# Patient Record
Sex: Female | Born: 1975
Health system: Southern US, Community
[De-identification: ages and names within clinical notes are randomized; demographics above are authoritative.]

## PROBLEM LIST (undated history)

## (undated) DIAGNOSIS — D259 Leiomyoma of uterus, unspecified: Secondary | ICD-10-CM

## (undated) DIAGNOSIS — J309 Allergic rhinitis, unspecified: Secondary | ICD-10-CM

## (undated) DIAGNOSIS — F419 Anxiety disorder, unspecified: Secondary | ICD-10-CM

## (undated) HISTORY — PX: APPENDECTOMY: SHX54

---

## 2003-02-27 ENCOUNTER — Emergency Department (HOSPITAL_COMMUNITY): Admission: EM | Admit: 2003-02-27 | Discharge: 2003-02-27 | Payer: Self-pay | Admitting: Emergency Medicine

## 2004-08-02 ENCOUNTER — Other Ambulatory Visit: Admission: RE | Admit: 2004-08-02 | Discharge: 2004-08-02 | Payer: Self-pay | Admitting: Obstetrics and Gynecology

## 2005-01-22 ENCOUNTER — Ambulatory Visit: Payer: Self-pay | Admitting: Obstetrics and Gynecology

## 2005-01-22 ENCOUNTER — Inpatient Hospital Stay (HOSPITAL_COMMUNITY): Admission: AD | Admit: 2005-01-22 | Discharge: 2005-01-22 | Payer: Self-pay | Admitting: Obstetrics and Gynecology

## 2005-01-28 ENCOUNTER — Ambulatory Visit: Payer: Self-pay | Admitting: *Deleted

## 2005-02-04 ENCOUNTER — Ambulatory Visit: Payer: Self-pay | Admitting: *Deleted

## 2005-02-11 ENCOUNTER — Ambulatory Visit: Payer: Self-pay | Admitting: *Deleted

## 2005-02-12 ENCOUNTER — Inpatient Hospital Stay (HOSPITAL_COMMUNITY): Admission: AD | Admit: 2005-02-12 | Discharge: 2005-02-12 | Payer: Self-pay | Admitting: Obstetrics and Gynecology

## 2005-02-18 ENCOUNTER — Inpatient Hospital Stay (HOSPITAL_COMMUNITY): Admission: AD | Admit: 2005-02-18 | Discharge: 2005-02-19 | Payer: Self-pay | Admitting: Family Medicine

## 2005-02-18 ENCOUNTER — Ambulatory Visit: Payer: Self-pay | Admitting: Family Medicine

## 2008-08-08 ENCOUNTER — Ambulatory Visit: Payer: Self-pay | Admitting: Family Medicine

## 2008-08-08 DIAGNOSIS — L03119 Cellulitis of unspecified part of limb: Secondary | ICD-10-CM

## 2008-08-08 DIAGNOSIS — L02419 Cutaneous abscess of limb, unspecified: Secondary | ICD-10-CM | POA: Insufficient documentation

## 2015-06-25 DIAGNOSIS — R875 Abnormal microbiological findings in specimens from female genital organs: Secondary | ICD-10-CM | POA: Diagnosis not present

## 2015-06-25 DIAGNOSIS — R718 Other abnormality of red blood cells: Secondary | ICD-10-CM | POA: Diagnosis not present

## 2015-06-25 DIAGNOSIS — Z124 Encounter for screening for malignant neoplasm of cervix: Secondary | ICD-10-CM | POA: Diagnosis not present

## 2015-06-25 DIAGNOSIS — Z0000 Encounter for general adult medical examination without abnormal findings: Secondary | ICD-10-CM | POA: Diagnosis not present

## 2015-06-25 DIAGNOSIS — Z13220 Encounter for screening for lipoid disorders: Secondary | ICD-10-CM | POA: Diagnosis not present

## 2015-07-12 DIAGNOSIS — Z1231 Encounter for screening mammogram for malignant neoplasm of breast: Secondary | ICD-10-CM | POA: Diagnosis not present

## 2015-08-15 DIAGNOSIS — D225 Melanocytic nevi of trunk: Secondary | ICD-10-CM | POA: Diagnosis not present

## 2016-01-14 DIAGNOSIS — F439 Reaction to severe stress, unspecified: Secondary | ICD-10-CM | POA: Diagnosis not present

## 2016-01-14 DIAGNOSIS — F418 Other specified anxiety disorders: Secondary | ICD-10-CM | POA: Diagnosis not present

## 2016-02-20 DIAGNOSIS — F4322 Adjustment disorder with anxiety: Secondary | ICD-10-CM | POA: Diagnosis not present

## 2016-02-20 DIAGNOSIS — F418 Other specified anxiety disorders: Secondary | ICD-10-CM | POA: Diagnosis not present

## 2016-02-20 DIAGNOSIS — F439 Reaction to severe stress, unspecified: Secondary | ICD-10-CM | POA: Diagnosis not present

## 2016-03-04 DIAGNOSIS — F4322 Adjustment disorder with anxiety: Secondary | ICD-10-CM | POA: Diagnosis not present

## 2016-03-18 DIAGNOSIS — F4322 Adjustment disorder with anxiety: Secondary | ICD-10-CM | POA: Diagnosis not present

## 2016-04-09 DIAGNOSIS — F4322 Adjustment disorder with anxiety: Secondary | ICD-10-CM | POA: Diagnosis not present

## 2016-09-23 DIAGNOSIS — Z130 Encounter for screening for diseases of the blood and blood-forming organs and certain disorders involving the immune mechanism: Secondary | ICD-10-CM | POA: Diagnosis not present

## 2016-09-23 DIAGNOSIS — Z0000 Encounter for general adult medical examination without abnormal findings: Secondary | ICD-10-CM | POA: Diagnosis not present

## 2016-09-23 DIAGNOSIS — Z13220 Encounter for screening for lipoid disorders: Secondary | ICD-10-CM | POA: Diagnosis not present

## 2016-12-21 DIAGNOSIS — J0101 Acute recurrent maxillary sinusitis: Secondary | ICD-10-CM | POA: Diagnosis not present

## 2017-01-15 DIAGNOSIS — F439 Reaction to severe stress, unspecified: Secondary | ICD-10-CM | POA: Diagnosis not present

## 2017-01-15 DIAGNOSIS — F418 Other specified anxiety disorders: Secondary | ICD-10-CM | POA: Diagnosis not present

## 2017-02-04 DIAGNOSIS — J069 Acute upper respiratory infection, unspecified: Secondary | ICD-10-CM | POA: Diagnosis not present

## 2017-03-12 DIAGNOSIS — F439 Reaction to severe stress, unspecified: Secondary | ICD-10-CM | POA: Diagnosis not present

## 2017-03-12 DIAGNOSIS — R918 Other nonspecific abnormal finding of lung field: Secondary | ICD-10-CM | POA: Diagnosis not present

## 2017-03-12 DIAGNOSIS — F418 Other specified anxiety disorders: Secondary | ICD-10-CM | POA: Diagnosis not present

## 2017-03-12 DIAGNOSIS — J069 Acute upper respiratory infection, unspecified: Secondary | ICD-10-CM | POA: Diagnosis not present

## 2017-07-09 ENCOUNTER — Other Ambulatory Visit (HOSPITAL_COMMUNITY)
Admission: RE | Admit: 2017-07-09 | Discharge: 2017-07-09 | Disposition: A | Discharge status: Home / Self Care | Payer: BLUE CROSS/BLUE SHIELD | Source: Ambulatory Visit | Attending: Nurse Practitioner | Admitting: Nurse Practitioner

## 2017-07-09 ENCOUNTER — Other Ambulatory Visit: Payer: Self-pay | Admitting: Nurse Practitioner

## 2017-07-09 DIAGNOSIS — Z716 Tobacco abuse counseling: Secondary | ICD-10-CM | POA: Diagnosis not present

## 2017-07-09 DIAGNOSIS — Z01419 Encounter for gynecological examination (general) (routine) without abnormal findings: Secondary | ICD-10-CM | POA: Insufficient documentation

## 2017-07-09 DIAGNOSIS — N920 Excessive and frequent menstruation with regular cycle: Secondary | ICD-10-CM | POA: Diagnosis not present

## 2017-07-15 LAB — CYTOLOGY - PAP
Adequacy: ABSENT
Diagnosis: NEGATIVE
HPV: NOT DETECTED

## 2017-07-16 DIAGNOSIS — N920 Excessive and frequent menstruation with regular cycle: Secondary | ICD-10-CM | POA: Diagnosis not present

## 2017-07-22 DIAGNOSIS — N920 Excessive and frequent menstruation with regular cycle: Secondary | ICD-10-CM | POA: Diagnosis not present

## 2017-10-06 DIAGNOSIS — G44209 Tension-type headache, unspecified, not intractable: Secondary | ICD-10-CM | POA: Diagnosis not present

## 2017-10-06 DIAGNOSIS — L723 Sebaceous cyst: Secondary | ICD-10-CM | POA: Diagnosis not present

## 2017-12-15 DIAGNOSIS — J029 Acute pharyngitis, unspecified: Secondary | ICD-10-CM | POA: Diagnosis not present

## 2017-12-15 DIAGNOSIS — J020 Streptococcal pharyngitis: Secondary | ICD-10-CM | POA: Diagnosis not present

## 2018-04-13 DIAGNOSIS — E010 Iodine-deficiency related diffuse (endemic) goiter: Secondary | ICD-10-CM | POA: Diagnosis not present

## 2018-04-13 DIAGNOSIS — Z13220 Encounter for screening for lipoid disorders: Secondary | ICD-10-CM | POA: Diagnosis not present

## 2018-04-13 DIAGNOSIS — Z0000 Encounter for general adult medical examination without abnormal findings: Secondary | ICD-10-CM | POA: Diagnosis not present

## 2018-04-15 ENCOUNTER — Other Ambulatory Visit: Payer: Self-pay | Admitting: Physician Assistant

## 2018-04-15 DIAGNOSIS — E010 Iodine-deficiency related diffuse (endemic) goiter: Secondary | ICD-10-CM

## 2018-05-10 DIAGNOSIS — J029 Acute pharyngitis, unspecified: Secondary | ICD-10-CM | POA: Diagnosis not present

## 2018-05-10 DIAGNOSIS — J0140 Acute pansinusitis, unspecified: Secondary | ICD-10-CM | POA: Diagnosis not present

## 2018-05-10 DIAGNOSIS — J111 Influenza due to unidentified influenza virus with other respiratory manifestations: Secondary | ICD-10-CM | POA: Diagnosis not present

## 2018-05-11 ENCOUNTER — Ambulatory Visit
Admission: RE | Admit: 2018-05-11 | Discharge: 2018-05-11 | Disposition: A | Discharge status: Home / Self Care | Payer: BLUE CROSS/BLUE SHIELD | Source: Ambulatory Visit | Attending: Physician Assistant | Admitting: Physician Assistant

## 2018-05-11 DIAGNOSIS — E042 Nontoxic multinodular goiter: Secondary | ICD-10-CM | POA: Diagnosis not present

## 2018-05-11 DIAGNOSIS — E010 Iodine-deficiency related diffuse (endemic) goiter: Secondary | ICD-10-CM

## 2018-05-14 ENCOUNTER — Other Ambulatory Visit: Payer: Self-pay | Admitting: Physician Assistant

## 2018-05-14 DIAGNOSIS — E041 Nontoxic single thyroid nodule: Secondary | ICD-10-CM

## 2018-05-18 ENCOUNTER — Other Ambulatory Visit (HOSPITAL_COMMUNITY)
Admission: RE | Admit: 2018-05-18 | Discharge: 2018-05-18 | Disposition: A | Discharge status: Home / Self Care | Payer: BLUE CROSS/BLUE SHIELD | Source: Ambulatory Visit | Attending: Physician Assistant | Admitting: Physician Assistant

## 2018-05-18 ENCOUNTER — Ambulatory Visit
Admission: RE | Admit: 2018-05-18 | Discharge: 2018-05-18 | Disposition: A | Discharge status: Home / Self Care | Payer: BLUE CROSS/BLUE SHIELD | Source: Ambulatory Visit | Attending: Physician Assistant | Admitting: Physician Assistant

## 2018-05-18 DIAGNOSIS — E041 Nontoxic single thyroid nodule: Secondary | ICD-10-CM

## 2018-05-18 NOTE — Procedures (Signed)
PROCEDURE SUMMARY:  Using direct ultrasound guidance, 4 passes were made using 25 g needles into the nodule within the left lobe of the thyroid.   Ultrasound was used to confirm needle placements on all occasions.   EBL = trace  Specimens were sent to Pathology for analysis.  See procedure note under Imaging tab in Epic for full procedure details.  Tekela Garguilo S Zaela Graley PA-C 05/18/2018 9:50 AM

## 2018-07-29 DIAGNOSIS — Z03818 Encounter for observation for suspected exposure to other biological agents ruled out: Secondary | ICD-10-CM | POA: Diagnosis not present

## 2019-01-17 DIAGNOSIS — N898 Other specified noninflammatory disorders of vagina: Secondary | ICD-10-CM | POA: Diagnosis not present

## 2019-01-17 DIAGNOSIS — R102 Pelvic and perineal pain: Secondary | ICD-10-CM | POA: Diagnosis not present

## 2019-01-17 DIAGNOSIS — K602 Anal fissure, unspecified: Secondary | ICD-10-CM | POA: Diagnosis not present

## 2019-02-25 DIAGNOSIS — Z20828 Contact with and (suspected) exposure to other viral communicable diseases: Secondary | ICD-10-CM | POA: Diagnosis not present

## 2019-02-25 DIAGNOSIS — U071 COVID-19: Secondary | ICD-10-CM | POA: Diagnosis not present

## 2019-03-05 DIAGNOSIS — R0981 Nasal congestion: Secondary | ICD-10-CM | POA: Diagnosis not present

## 2019-03-05 DIAGNOSIS — B9721 SARS-associated coronavirus as the cause of diseases classified elsewhere: Secondary | ICD-10-CM | POA: Diagnosis not present

## 2019-03-05 DIAGNOSIS — G44009 Cluster headache syndrome, unspecified, not intractable: Secondary | ICD-10-CM | POA: Diagnosis not present

## 2019-03-05 DIAGNOSIS — R5383 Other fatigue: Secondary | ICD-10-CM | POA: Diagnosis not present

## 2019-07-15 DIAGNOSIS — H6980 Other specified disorders of Eustachian tube, unspecified ear: Secondary | ICD-10-CM | POA: Diagnosis not present

## 2019-08-28 DIAGNOSIS — N3000 Acute cystitis without hematuria: Secondary | ICD-10-CM | POA: Diagnosis not present

## 2019-09-12 ENCOUNTER — Other Ambulatory Visit: Payer: Self-pay | Admitting: Family Medicine

## 2019-09-12 DIAGNOSIS — Z1231 Encounter for screening mammogram for malignant neoplasm of breast: Secondary | ICD-10-CM

## 2019-09-28 ENCOUNTER — Ambulatory Visit
Admission: RE | Admit: 2019-09-28 | Discharge: 2019-09-28 | Disposition: A | Discharge status: Home / Self Care | Payer: BC Managed Care – PPO | Source: Ambulatory Visit | Attending: Family Medicine | Admitting: Family Medicine

## 2019-09-28 ENCOUNTER — Other Ambulatory Visit: Payer: Self-pay

## 2019-09-28 DIAGNOSIS — Z1231 Encounter for screening mammogram for malignant neoplasm of breast: Secondary | ICD-10-CM | POA: Diagnosis not present

## 2019-10-18 DIAGNOSIS — Z03818 Encounter for observation for suspected exposure to other biological agents ruled out: Secondary | ICD-10-CM | POA: Diagnosis not present

## 2019-10-18 DIAGNOSIS — Z20822 Contact with and (suspected) exposure to covid-19: Secondary | ICD-10-CM | POA: Diagnosis not present

## 2019-10-24 DIAGNOSIS — M79645 Pain in left finger(s): Secondary | ICD-10-CM | POA: Diagnosis not present

## 2019-10-25 DIAGNOSIS — Z20822 Contact with and (suspected) exposure to covid-19: Secondary | ICD-10-CM | POA: Diagnosis not present

## 2019-10-25 DIAGNOSIS — U071 COVID-19: Secondary | ICD-10-CM | POA: Diagnosis not present

## 2019-11-02 DIAGNOSIS — N898 Other specified noninflammatory disorders of vagina: Secondary | ICD-10-CM | POA: Diagnosis not present

## 2019-11-02 DIAGNOSIS — Z30015 Encounter for initial prescription of vaginal ring hormonal contraceptive: Secondary | ICD-10-CM | POA: Diagnosis not present

## 2019-11-02 DIAGNOSIS — N920 Excessive and frequent menstruation with regular cycle: Secondary | ICD-10-CM | POA: Diagnosis not present

## 2019-11-02 DIAGNOSIS — Z01419 Encounter for gynecological examination (general) (routine) without abnormal findings: Secondary | ICD-10-CM | POA: Diagnosis not present

## 2019-11-02 DIAGNOSIS — R87610 Atypical squamous cells of undetermined significance on cytologic smear of cervix (ASC-US): Secondary | ICD-10-CM | POA: Diagnosis not present

## 2020-01-16 DIAGNOSIS — R102 Pelvic and perineal pain: Secondary | ICD-10-CM | POA: Diagnosis not present

## 2020-01-16 DIAGNOSIS — N920 Excessive and frequent menstruation with regular cycle: Secondary | ICD-10-CM | POA: Diagnosis not present

## 2020-01-17 DIAGNOSIS — N920 Excessive and frequent menstruation with regular cycle: Secondary | ICD-10-CM | POA: Diagnosis not present

## 2020-01-17 DIAGNOSIS — R102 Pelvic and perineal pain: Secondary | ICD-10-CM | POA: Diagnosis not present

## 2020-05-10 IMAGING — US US FNA BIOPSY THYROID 1ST LESION
1 series · 13 of 16 positions shown · non-contrast
Comparison: Ultrasound done May 11, 2018

MEDICATIONS:
1% lidocaine 3 mL

COMPLICATIONS:
None immediate.

INDICATION: Indeterminate thyroid nodule

EXAM:
ULTRASOUND GUIDED FINE NEEDLE ASPIRATION OF INDETERMINATE THYROID
NODULE
TECHNIQUE: Informed written consent was obtained from the patient after a
discussion of the risks, benefits and alternatives to treatment.
Questions regarding the procedure were encouraged and answered. A
timeout was performed prior to the initiation of the procedure.

[Series 1: us fna biopsy thyroid 1st lesion · 0.04mm/px · 16 acquisitions, 13 frames shown]
[im 1/16]
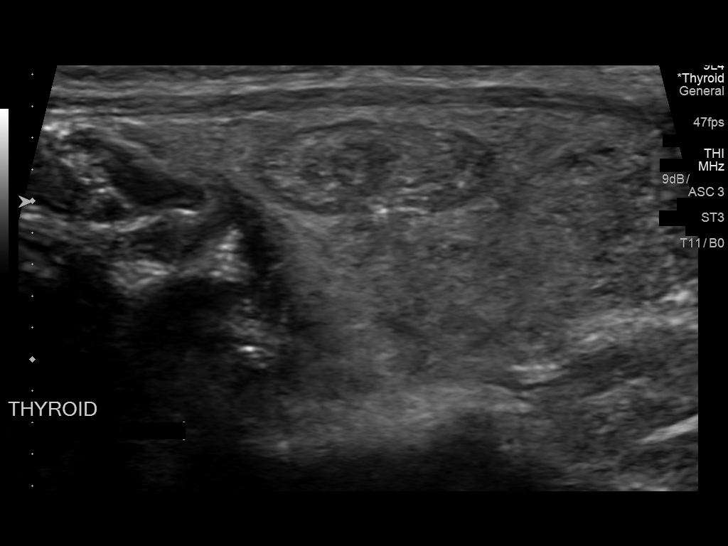
[im 2/16]
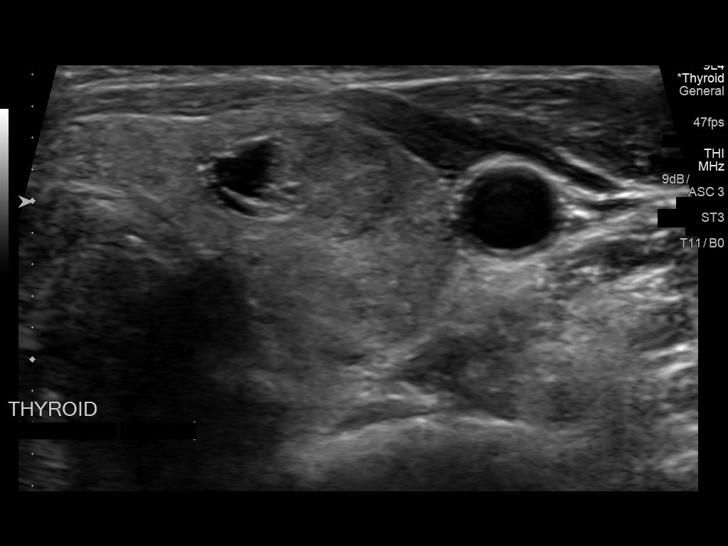
[im 4/16]
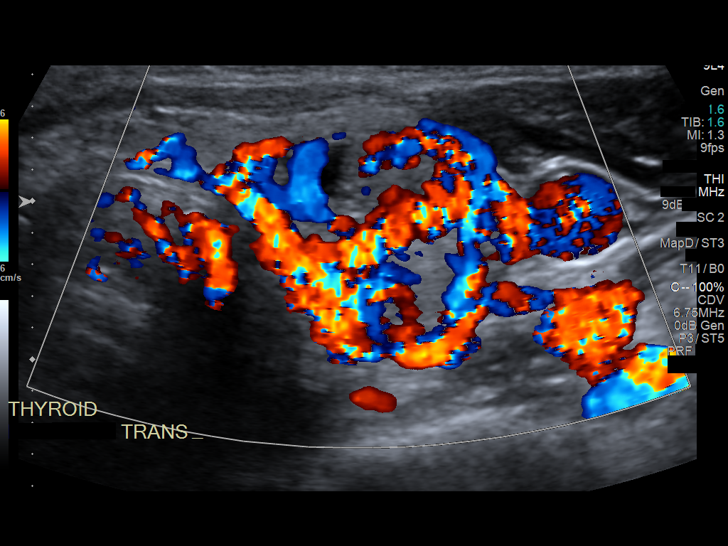
[im 5/16]
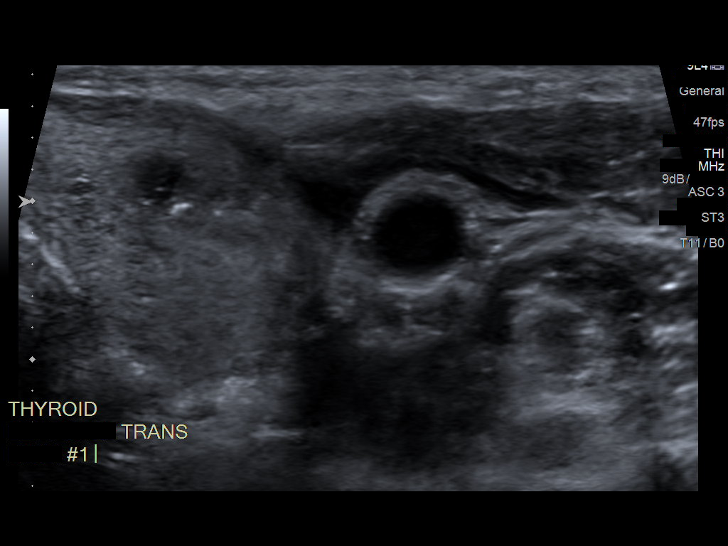
[im 6/16]
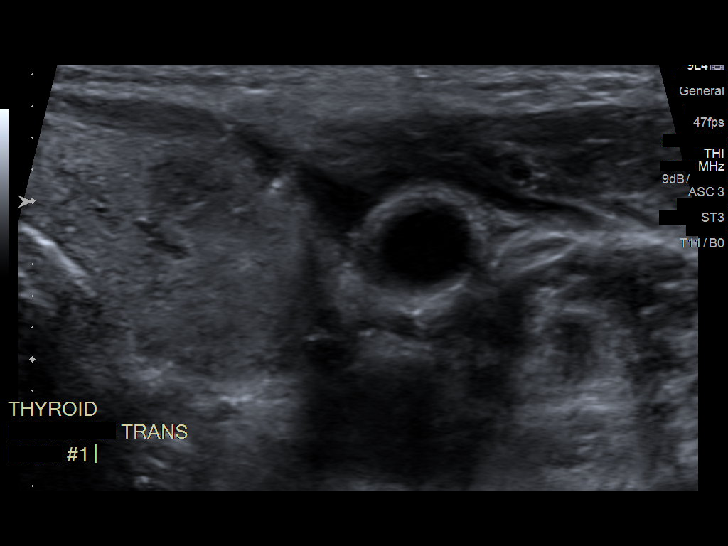
[im 7/16]
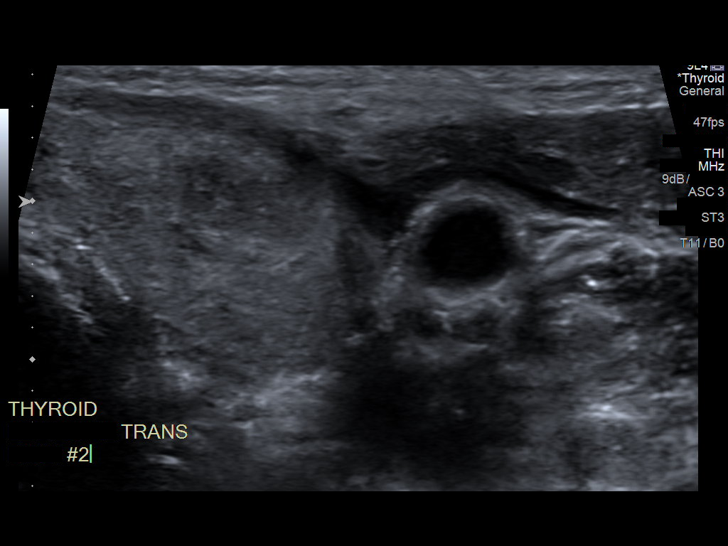
[im 9/16]
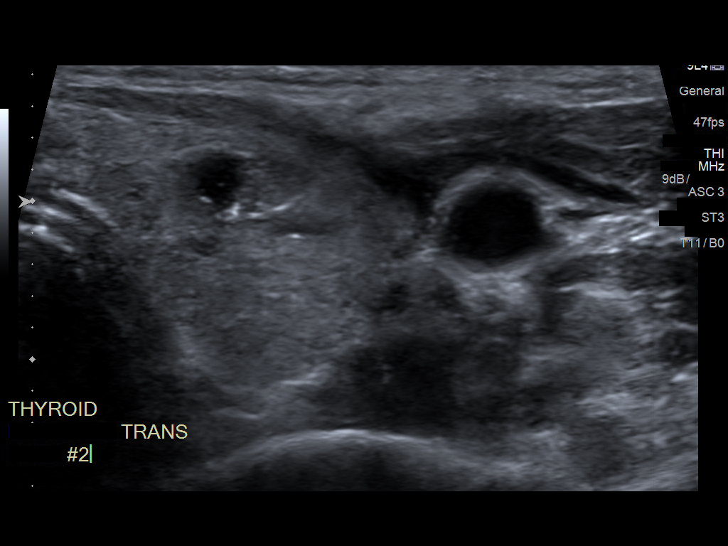
[im 10/16]
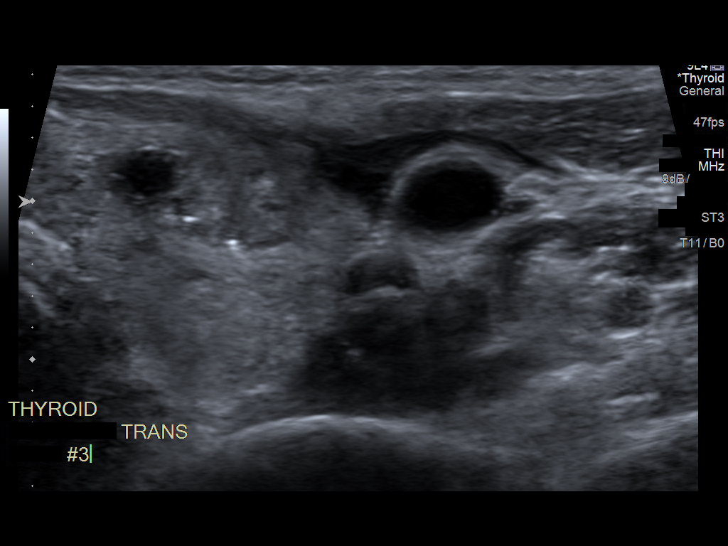
[im 11/16]
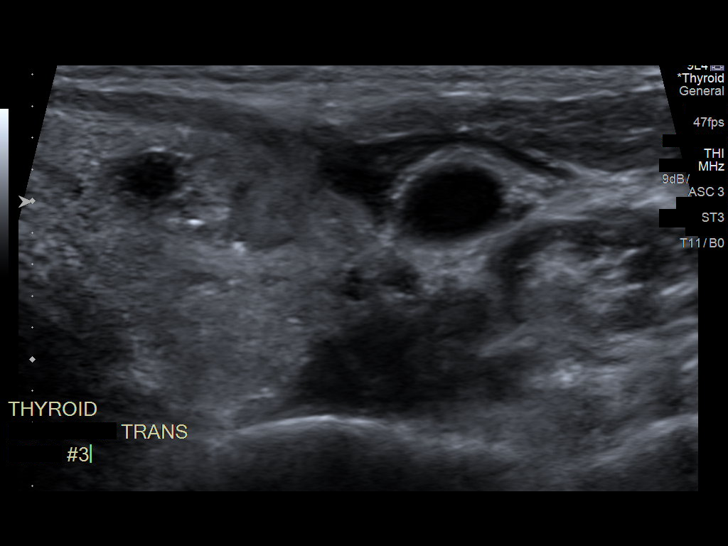
[im 12/16]
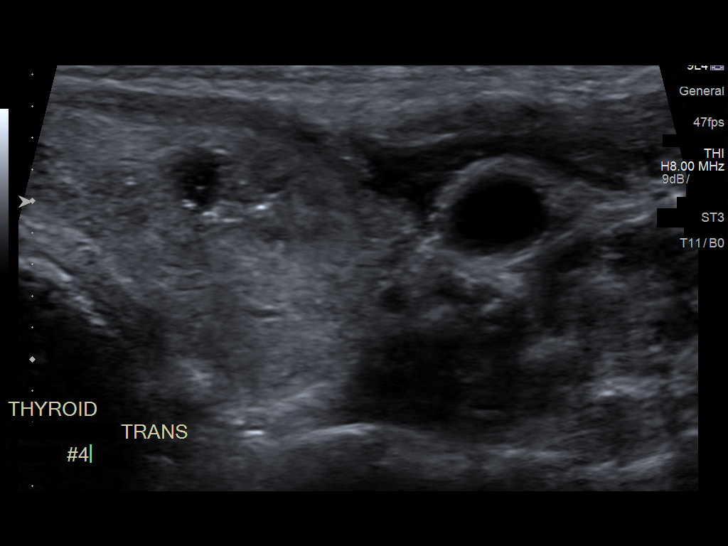
[im 13/16]
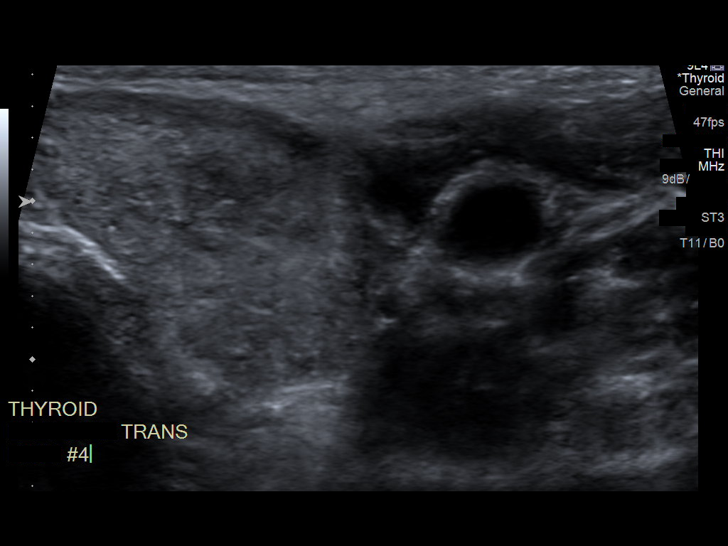
[im 15/16]
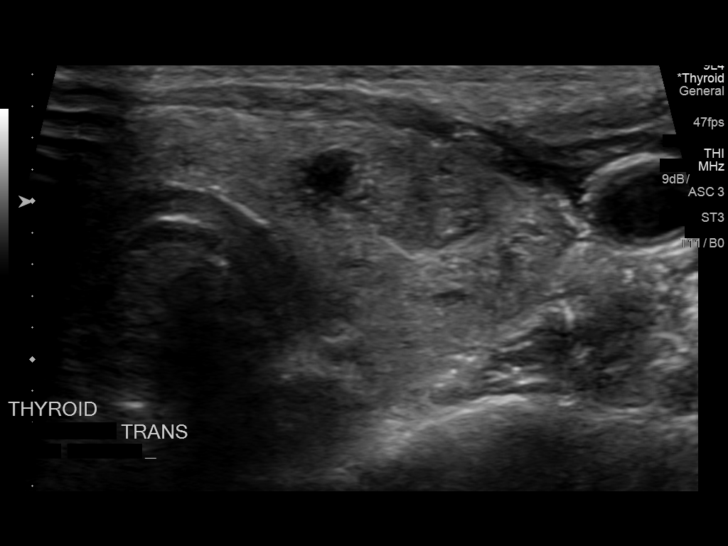
[im 16/16]
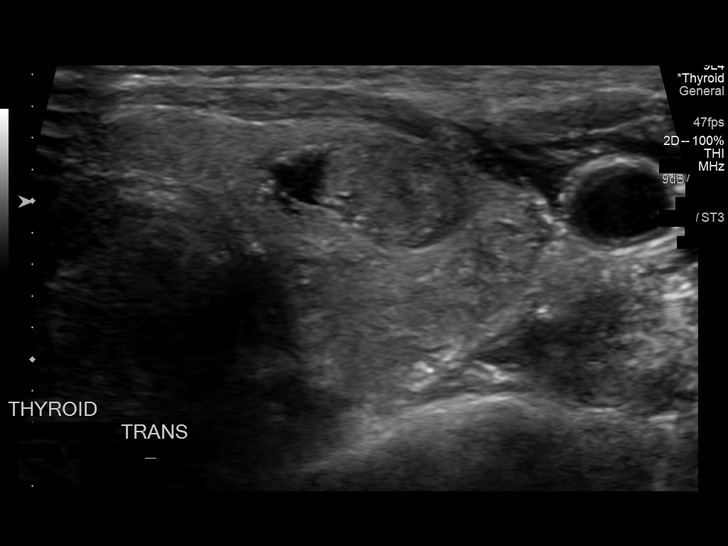

[13 of 16 positions shown; findings below may reference images not displayed]

Pre-procedural ultrasound scanning demonstrated unchanged size and
appearance of the indeterminate nodule within the left lobe of the
thyroid.

The procedure was planned. The neck was prepped in the usual sterile
fashion, and a sterile drape was applied covering the operative
field. A timeout was performed prior to the initiation of the
procedure. Local anesthesia was provided with 1% lidocaine.

Under direct ultrasound guidance, 4 FNA biopsies were performed of
the left thyroid nodule with a 25 gauge needle. Multiple ultrasound
images were saved for procedural documentation purposes. The samples
were prepared and submitted to pathology.

Limited post procedural scanning was negative for hematoma or
additional complication. Dressings were placed. The patient
tolerated the above procedures procedure well without immediate
postprocedural complication.
FINDINGS: FINDINGS
Nodule reference number based on prior diagnostic ultrasound: 3

Maximum size: 1.5 cm

Location: Left  ;  Mid

ACR TI-RADS risk category:  TR4

Reason for biopsy: meets ACR TI-RADS criteria

Ultrasound imaging confirms appropriate placement of the needles
within the thyroid nodule.
IMPRESSION: Technically successful ultrasound guided fine needle aspiration of
the left thyroid nodule.

## 2020-05-24 DIAGNOSIS — Z30015 Encounter for initial prescription of vaginal ring hormonal contraceptive: Secondary | ICD-10-CM | POA: Diagnosis not present

## 2020-05-24 DIAGNOSIS — F418 Other specified anxiety disorders: Secondary | ICD-10-CM | POA: Diagnosis not present

## 2020-05-24 DIAGNOSIS — L409 Psoriasis, unspecified: Secondary | ICD-10-CM | POA: Diagnosis not present

## 2020-05-24 DIAGNOSIS — J309 Allergic rhinitis, unspecified: Secondary | ICD-10-CM | POA: Diagnosis not present

## 2020-06-12 DIAGNOSIS — M9903 Segmental and somatic dysfunction of lumbar region: Secondary | ICD-10-CM | POA: Diagnosis not present

## 2020-11-08 DIAGNOSIS — R635 Abnormal weight gain: Secondary | ICD-10-CM | POA: Diagnosis not present

## 2020-11-08 DIAGNOSIS — Z01419 Encounter for gynecological examination (general) (routine) without abnormal findings: Secondary | ICD-10-CM | POA: Diagnosis not present

## 2020-11-08 DIAGNOSIS — Z113 Encounter for screening for infections with a predominantly sexual mode of transmission: Secondary | ICD-10-CM | POA: Diagnosis not present

## 2021-02-26 DIAGNOSIS — J00 Acute nasopharyngitis [common cold]: Secondary | ICD-10-CM | POA: Diagnosis not present

## 2021-02-26 DIAGNOSIS — R52 Pain, unspecified: Secondary | ICD-10-CM | POA: Diagnosis not present

## 2021-04-17 DIAGNOSIS — J069 Acute upper respiratory infection, unspecified: Secondary | ICD-10-CM | POA: Diagnosis not present

## 2021-04-17 DIAGNOSIS — N920 Excessive and frequent menstruation with regular cycle: Secondary | ICD-10-CM | POA: Diagnosis not present

## 2021-04-23 DIAGNOSIS — N920 Excessive and frequent menstruation with regular cycle: Secondary | ICD-10-CM | POA: Diagnosis not present

## 2021-04-30 ENCOUNTER — Encounter (HOSPITAL_BASED_OUTPATIENT_CLINIC_OR_DEPARTMENT_OTHER): Payer: Self-pay | Admitting: Obstetrics and Gynecology

## 2021-05-01 ENCOUNTER — Encounter (HOSPITAL_BASED_OUTPATIENT_CLINIC_OR_DEPARTMENT_OTHER): Payer: Self-pay | Admitting: Obstetrics and Gynecology

## 2021-05-02 NOTE — Progress Notes (Signed)
Spoke w/ via phone for pre-op interview---pt Lab needs dos----    urine poct per anesthesia, surgery orders pending           Lab results------none COVID test -----patient states asymptomatic no test needed Arrive at -------1200 pm 05-08-2021 NPO after MN NO Solid Food.  Clear liquids from MN until---1100 am Med rec completed Medications to take morning of surgery -----loratadine, flonase Diabetic medication -----n/a Patient instructed no nail polish to be worn day of surgery patient to remove s & s from both index fingers for surgery Patient instructed to bring photo id and insurance card day of surgery Patient aware to have Driver (ride ) / caregiver  boyfriend doug   for 24 hours after surgery  Patient Special Instructions -----surgery orders req dr Landry Mellow epic ib Pre-Op special Istructions -----none Patient verbalized understanding of instructions that were given at this phone interview. Patient denies shortness of breath, chest pain, fever, cough at this phone interview.   Medical history done 05-01-2021 [facility] RN

## 2021-05-07 ENCOUNTER — Other Ambulatory Visit: Payer: Self-pay | Admitting: Obstetrics and Gynecology

## 2021-05-07 DIAGNOSIS — N920 Excessive and frequent menstruation with regular cycle: Secondary | ICD-10-CM

## 2021-05-07 NOTE — H&P (Deleted)
  The note originally documented on this encounter has been moved the the encounter in which it belongs.  

## 2021-05-07 NOTE — H&P (Signed)
Subjective: Chief Complaint(s):   Pelvic/Preop/Heavy menses   HPI:  General 46 yo presents for pre-op visit for hysteroscopy D/C with hydrothermal ablation for manangement of menorrhagia with regular cycle on 05/08/2021 at 2:00p. Last visit on 04/17/2021, pt reported her periods had been very heavy. She reported her LMP was Jan 26th, 2023 and it lasted 6 days. She reported on her heaviest day she is changing an ultra tampon in an hour. She reported this heavy bleeding lasts at least 3 days of her cycle. Pt reported accidents on her clothes due to heavy bleeding. Pt has tried the NuvaRing in the past with little relief.  Discussed performing a repeat US for interval evaluation. Discussed vaginal hysterectomy. Discussed risks of hysterectomy including but not limited to infection, bleeding, conversion to larger incision, damage to her bowel, bladder, or ureters, with the need for further surgery. Discussed post-surgery avoidance of driving for 1 week and avoidance of lifting weights greater than 10 lbs or intercourse for 6-8 weeks after the procedure. Discussed hydrothermal ablation. Discussed Irving Copas sure ablation. DIscussed post-surgery avoidance of intercourse for 2 weeks after the procedure. Pt is interested in hydrothermal ablation at this time. Pt reports sinus pressure since the first Thursday of Jan 2023. Pt reports she has tried mucinex and aromatherapy with little relief. Current Medication: Taking  Clobetasol Propionate 5.49 % Shampoo 1 application Externally Once a day, Notes: Needs refill.     Fluticasone Propionate 50 MCG/ACT Suspension USE 1-2 SPRAYS IN EACH NOSTRIL ONCE A DAY , Notes: prn.     Loratadine-D 24HR(Loratadine-Pseudoephedrine ER) 10-240 MG Tablet Extended Release 24 Hour 1 tablet as needed Orally Once a day.     Ferrous Sulfate 325 (65 Fe) MG Tablet 1 tablet Orally Once a day.     Anusol-HC(Hydrocortisone Acetate) 2.5 % Cream 1 application to affected area Rectal Twice a day  for 7 days then prn, Notes: PRN.     Ibuprofen 200 MG Tablet Orally 3 tablets as needed, Notes: prn.     Aleve(Naproxen) , Notes: prn.   Discontinued  Zithromax Z-pak(Azithromycin) 250 MG Tablet 2 pills by mouth the first day, then 1 pill once a day by mouth for 4 days Orally Once a day.     Zithromax Z-pak(Azithromycin) 250 MG Tablet 2 tablets the first day, then 1 tablet once a day by mouth for 4 days Orally Once a day.     Medication List reviewed and reconciled with the patient.  Medical History:  seasonal allergies     fibroids      Allergies/Intolerance: N.K.D.A. Gyn History:  Sexual activity currently sexually active. Periods : every month, regular. LMP 04/11/2021. Denies Birth control none. Last pap smear date 11/02/2019 - ASCUS, ECC present, HPV neg. Last mammogram date 09/28/2019. H/O Abnormal pap smear 1999 during pregnancy 2021 - ASCUS. Denies STD. Menarche 85.   OB History:  Number of pregnancies 4. Pregnancy # 1 vaginal delivery, live birth, , girl. Pregnancy # 2 vaginal delivery, live birth, girl. Pregnancy # 3 vaginal delivery, live birth, boy. Pregnancy # 4: vaginal delivery, live birth, girl.   Surgical History:  Appendectomy 2000     Lasix in Lt eye 2016   Hospitalization:  Childbirth 4x   Family History:  Father: deceased, Hyperlipidemia    Mother: deceased, AML    Maternal Grand Father: Heart disease    Sister 1: alive    Maternal aunt: deceased, Lung cancer, diagnosed with Breast cancer    1 sister(s) . 1 son(s) ,  3 daughter(s) .    pt had covid in 2021, denies any GYN family cancer hx, No Family History of Colon Cancer.  Social History: General Tobacco use cigarettes: Former smoker, Quit in year 03/2019, Tobacco history last updated 04/23/2021, Vaping Yes.  EXPOSURE TO PASSIVE SMOKE: yes.  Alcohol: yes, Rare.  Caffeine: yes, 2+ servings daily, coffee, tea.  Recreational drug use: yes, In high school-marijuana, last use age 34.  Exercise: yes,  Walking around her neighborhood 6x a wk.  Marital Status: Divorced,.  Children: 4 ages 76-20 (3 girls, 1 boy).  EDUCATION: Some College.  OCCUPATION: employed, [employer].  Seat belt use: most of the time.  ROS: CONSTITUTIONAL No" options="no,yes" propid="91" itemid="193425" categoryid="10464" encounterid="[id number]"Chills No. No" options="no,yes" propid="91" itemid="172899" categoryid="10464" encounterid="[id number]"Fatigue No. No" options="no,yes" propid="91" itemid="10467" categoryid="10464" encounterid="[id number]"Fever No. No" options="no,yes" propid="91" itemid="193426" categoryid="10464" encounterid="[id number]"Night sweats No. No" options="no,yes" propid="91" itemid="444261" categoryid="10464" encounterid="[id number]"Recent travel outside [place] No. No" options="no,yes" propid="91" itemid="193427" categoryid="10464" encounterid="14795855"Sweats No. No" options="no,yes" propid="91" itemid="194825" categoryid="10464" encounterid="[id number]"Weight change No.  OPHTHALMOLOGY no" options="no,yes" propid="91" itemid="12520" categoryid="12516" encounterid="[id number]"Blurring of vision no. no" options="no,yes" propid="91" itemid="193469" categoryid="12516" encounterid="[id number]"Change in vision no. no" options="no,yes" propid="91" itemid="194379" categoryid="12516" encounterid="[id number]"Double vision no.  ENT no" options="no,yes" propid="91" itemid="193612" categoryid="10481" encounterid="[id number]"Dizziness no. Nose bleeds no. Sore throat no. Teeth pain no.  ALLERGY no" options="no,yes" propid="91" itemid="202589" categoryid="138152" encounterid="14795855"Hives no.  CARDIOLOGY no" options="no,yes" propid="91" itemid="193603" categoryid="10488" encounterid="[id number]"Chest pain no. no" options="no,yes" propid="91" itemid="199089" categoryid="10488" encounterid="[id number]"High blood pressure no. no" options="no,yes" propid="91" itemid="202598" categoryid="10488" encounterid="14795855"Irregular heart beat no. no"  options="no,yes" propid="91" itemid="10491" categoryid="10488" encounterid="14795855"Leg edema no. no" options="no,yes" propid="91" itemid="10490" categoryid="10488" encounterid="14795855"Palpitations no.  RESPIRATORY no" options="no" propid="91" itemid="270013" categoryid="138132" encounterid="14795855"Shortness of breath no. no" options="no,yes" propid="91" itemid="172745" categoryid="138132" encounterid="14795855"Cough no. no" options="no,yes" propid="91" itemid="193621" categoryid="138132" encounterid="14795855"Wheezing no.  UROLOGY no" options="no,yes" propid="91" itemid="194377" categoryid="138166" encounterid="14795855"Pain with urination no. no" options="no,yes" propid="91" itemid="193493" categoryid="138166" encounterid="14795855"Urinary urgency no. no" options="no,yes" propid="91" itemid="193492" categoryid="138166" encounterid="14795855"Urinary frequency no. no" options="no,yes" propid="91" itemid="138171" categoryid="138166" encounterid="14795855"Urinary incontinence no. No" options="no,yes" propid="91" itemid="138167" categoryid="138166" encounterid="14795855"Difficulty urinating No. No" options="no,yes" propid="91" itemid="138168" categoryid="138166" encounterid="14795855"Blood in urine No.  GASTROENTEROLOGY no" options="no,yes" propid="91" itemid="10496" categoryid="10494" encounterid="14795855"Abdominal pain no. no" options="no,yes" propid="91" itemid="193447" categoryid="10494" encounterid="14795855"Appetite change no. no" options="no,yes" propid="91" itemid="193448" categoryid="10494" encounterid="14795855"Bloating/belching no. no" options="no,yes" propid="91" itemid="10503" categoryid="10494" encounterid="14795855"Blood in stool or on toilet paper no. no" options="no,yes" propid="91" itemid="199106" categoryid="10494" encounterid="14795855"Change in bowel movements no. no" options="no,yes" propid="91" itemid="10501" categoryid="10494" encounterid="14795855"Constipation no. no"  options="no,yes" propid="91" itemid="10502" categoryid="10494" encounterid="14795855"Diarrhea no. no" options="no,yes" propid="91" itemid="199104" categoryid="10494" encounterid="14795855"Difficulty swallowing no. no" options="no,yes" propid="91" itemid="10499" categoryid="10494" encounterid="14795855"Nausea no.  FEMALE REPRODUCTIVE no" options="no,yes" propid="91" itemid="453725" categoryid="10525" encounterid="14795855"Vulvar pain no. no" options="no,yes" propid="91" itemid="453726" categoryid="10525" encounterid="14795855"Vulvar rash no. heavy menses" options="no, yes" propid="91" itemid="444315" categoryid="10525" encounterid="14795855"Abnormal vaginal bleeding heavy menses. no" options="no,yes" propid="91" itemid="186083" categoryid="10525" encounterid="14795855"Breast pain no. no" options="no,yes" propid="91" itemid="186084" categoryid="10525" encounterid="14795855"Nipple discharge no. no" options="no,yes" propid="91" itemid="275823" categoryid="10525" encounterid="14795855"Pain with intercourse no. no" options="no,yes" propid="91" itemid="186082" categoryid="10525" encounterid="14795855"Pelvic pain no. no" options="no,yes" propid="91" itemid="278230" categoryid="10525" encounterid="14795855"Unusual vaginal discharge no. no" options="no,yes" propid="91" itemid="278942" categoryid="10525" encounterid="14795855"Vaginal itching no.  MUSCULOSKELETAL no" options="no,yes" propid="91" itemid="193461" categoryid="10514" encounterid="14795855"Muscle aches no.  NEUROLOGY no" options="no,yes" propid="91" itemid="12513" categoryid="12512" encounterid="14795855"Headache no. no" options="no,yes" propid="91" itemid="12514" categoryid="12512" encounterid="14795855"Tingling/numbness no. no" options="no,yes" propid="91" itemid="193468" categoryid="12512" encounterid="14795855"Weakness no.  PSYCHOLOGY no" options="" propid="91" itemid="275919" categoryid="10520" encounterid="14795855"Depression no. no" options="no,yes"  propid="91" itemid="172748" categoryid="10520" encounterid="14795855"Anxiety no. no" options="no,yes" propid="91" itemid="199158" categoryid="10520" encounterid="14795855"Nervousness no. no" options="no,yes" propid="91" itemid="12502" categoryid="10520" encounterid="14795855"Sleep disturbances no. no " options="no,yes" propid="91" itemid="72718" categoryid="10520" encounterid="14795855"Suicidal ideation no .  ENDOCRINOLOGY no" options="no,yes" propid="91" itemid="194628" categoryid="12508" encounterid="14795855"Excessive thirst no. no" options="no,yes" propid="91" itemid="196285" categoryid="12508" encounterid="14795855"Excessive urination no. no"  options="no, yes" propid="91" itemid="444314" categoryid="12508" encounterid="14795855"Hair loss no. no" options="" propid="91" itemid="447284" categoryid="12508" encounterid="14795855"Heat or cold intolerance no.  HEMATOLOGY/LYMPH no" options="no,yes" propid="91" itemid="199152" categoryid="138157" encounterid="14795855"Abnormal bleeding no. no" options="no,yes" propid="91" itemid="170653" categoryid="138157" encounterid="14795855"Easy bruising no. no" options="no,yes" propid="91" itemid="138158" categoryid="138157" encounterid="14795855"Swollen glands no.  DERMATOLOGY no" options="no,yes" propid="91" itemid="199126" categoryid="12503" encounterid="14795855"New/changing skin lesion no. no" options="no,yes" propid="91" itemid="12504" categoryid="12503" encounterid="14795855"Rash no. no" options="" propid="91" itemid="444313" categoryid="12503" encounterid="14795855"Sores no.  Negative except as stated in HPI.  Objective: Vitals: Wt 135.4, Wt change 0.6 lbs, Ht 61.5, BMI 25.17, Pulse sitting 71, BP sitting 119/76.  Past Results: Examination:  General Examination alert, oriented, NAD " categoryPropId="10089" examid="193638"CONSTITUTIONAL: alert, oriented, NAD .  moist, warm" categoryPropId="10109" examid="193638"SKIN: moist, warm.  Conjunctiva clear"  categoryPropId="21468" examid="193638"EYES: Conjunctiva clear.  clear to auscultation bilaterally" categoryPropId="87" examid="193638"LUNGS: clear to auscultation bilaterally.  regular rate and rhythm" categoryPropId="86" examid="193638"HEART: regular rate and rhythm.  soft, non-tender/non-distended, bowel sounds present " categoryPropId="88" examid="193638"ABDOMEN: soft, non-tender/non-distended, bowel sounds present .  normal external genitalia, labia - unremarkable, vagina - pink moist mucosa, no lesions or abnormal discharge, cervix - no discharge or lesions or CMT, adnexa - no masses or tenderness, uterus - nontender and normal size on palpation " categoryPropId="13414" examid="193638"FEMALE GENITOURINARY: normal external genitalia, labia - unremarkable, vagina - pink moist mucosa, no lesions or abnormal discharge, cervix - no discharge or lesions or CMT, adnexa - no masses or tenderness, uterus - nontender and normal size on palpation .  affect normal, good eye contact" categoryPropId="16316" examid="193638"PSYCH: affect normal, good eye contact.  Physical Examination:   Assessment: Assessment:  Menorrhagia with regular cycle - N92.0 (Primary)     Plan: Treatment: Menorrhagia with regular cycle Imaging:US ECHO TRANSVAGINAL Notes: plan hysteroscopy D&C with hydrothermal ablation. r/b/a of surgery were discussed with the patient including but not limited to infection/ bleeding damage to uterus and surrounding organs wtih the need for futher surgery. pt voiced understanding and desires to proceed.Marland Kitchen

## 2021-05-08 ENCOUNTER — Ambulatory Visit (HOSPITAL_BASED_OUTPATIENT_CLINIC_OR_DEPARTMENT_OTHER)
Admission: RE | Admit: 2021-05-08 | Discharge: 2021-05-08 | Disposition: A | Discharge status: Home / Self Care | Payer: BC Managed Care – PPO | Attending: Obstetrics and Gynecology | Admitting: Obstetrics and Gynecology

## 2021-05-08 ENCOUNTER — Ambulatory Visit (HOSPITAL_BASED_OUTPATIENT_CLINIC_OR_DEPARTMENT_OTHER): Payer: BC Managed Care – PPO | Admitting: Certified Registered"

## 2021-05-08 ENCOUNTER — Encounter (HOSPITAL_BASED_OUTPATIENT_CLINIC_OR_DEPARTMENT_OTHER): Payer: Self-pay | Admitting: Obstetrics and Gynecology

## 2021-05-08 ENCOUNTER — Encounter (HOSPITAL_BASED_OUTPATIENT_CLINIC_OR_DEPARTMENT_OTHER)
Admission: RE | Disposition: A | Discharge status: Home / Self Care | Payer: Self-pay | Source: Home / Self Care | Attending: Obstetrics and Gynecology

## 2021-05-08 ENCOUNTER — Other Ambulatory Visit: Payer: Self-pay

## 2021-05-08 DIAGNOSIS — N858 Other specified noninflammatory disorders of uterus: Secondary | ICD-10-CM | POA: Diagnosis not present

## 2021-05-08 DIAGNOSIS — N920 Excessive and frequent menstruation with regular cycle: Secondary | ICD-10-CM | POA: Insufficient documentation

## 2021-05-08 HISTORY — DX: Allergic rhinitis, unspecified: J30.9

## 2021-05-08 HISTORY — DX: Leiomyoma of uterus, unspecified: D25.9

## 2021-05-08 HISTORY — DX: Anxiety disorder, unspecified: F41.9

## 2021-05-08 HISTORY — PX: DILITATION & CURRETTAGE/HYSTROSCOPY WITH HYDROTHERMAL ABLATION: SHX5570

## 2021-05-08 LAB — POCT PREGNANCY, URINE: Preg Test, Ur: NEGATIVE

## 2021-05-08 SURGERY — DILATATION & CURETTAGE/HYSTEROSCOPY WITH HYDROTHERMAL ABLATION
Anesthesia: General | Site: Uterus

## 2021-05-08 MED ORDER — GABAPENTIN 300 MG PO CAPS
300.0000 mg | ORAL_CAPSULE | ORAL | Status: AC
  Administered 2021-05-08: 300 mg via ORAL

## 2021-05-08 MED ORDER — PROPOFOL 10 MG/ML IV BOLUS
INTRAVENOUS | Status: DC | PRN
  Administered 2021-05-08: 180 mg via INTRAVENOUS

## 2021-05-08 MED ORDER — MEPERIDINE HCL 25 MG/ML IJ SOLN: 6.2500 mg | INTRAMUSCULAR | Status: DC | PRN

## 2021-05-08 MED ORDER — KETOROLAC TROMETHAMINE 30 MG/ML IJ SOLN
INTRAMUSCULAR | Status: DC | PRN
Start: 2021-05-08 — End: 2021-05-09
  Administered 2021-05-08: 30 mg via INTRAVENOUS

## 2021-05-08 MED ORDER — FENTANYL CITRATE (PF) 100 MCG/2ML IJ SOLN
INTRAMUSCULAR | Status: AC
  Filled 2021-05-08: qty 2

## 2021-05-08 MED ORDER — ACETAMINOPHEN 500 MG PO TABS: 1000.0000 mg | ORAL_TABLET | ORAL | Status: DC

## 2021-05-08 MED ORDER — PROMETHAZINE HCL 25 MG/ML IJ SOLN: 6.2500 mg | INTRAMUSCULAR | Status: DC | PRN

## 2021-05-08 MED ORDER — ONDANSETRON HCL 4 MG/2ML IJ SOLN
INTRAMUSCULAR | Status: DC | PRN
  Administered 2021-05-08: 4 mg via INTRAVENOUS

## 2021-05-08 MED ORDER — IBUPROFEN 800 MG PO TABS: 800.0000 mg | ORAL_TABLET | Freq: Three times a day (TID) | ORAL | 0 refills | Status: AC | PRN

## 2021-05-08 MED ORDER — SODIUM CHLORIDE 0.9 % IR SOLN
Status: DC | PRN
  Administered 2021-05-08 (×2): 3000 mL

## 2021-05-08 MED ORDER — ACETAMINOPHEN 500 MG PO TABS
ORAL_TABLET | ORAL | Status: AC
  Filled 2021-05-08: qty 2

## 2021-05-08 MED ORDER — LIDOCAINE HCL (PF) 0.5 % IJ SOLN
INTRAMUSCULAR | Status: DC | PRN
  Administered 2021-05-08: 60 mg via INTRAVENOUS

## 2021-05-08 MED ORDER — POVIDONE-IODINE 10 % EX SWAB: 2.0000 "application " | Freq: Once | CUTANEOUS | Status: DC

## 2021-05-08 MED ORDER — MIDAZOLAM HCL 2 MG/2ML IJ SOLN
INTRAMUSCULAR | Status: AC
  Filled 2021-05-08: qty 2

## 2021-05-08 MED ORDER — DEXAMETHASONE SODIUM PHOSPHATE 10 MG/ML IJ SOLN
INTRAMUSCULAR | Status: AC
  Filled 2021-05-08: qty 1

## 2021-05-08 MED ORDER — DEXAMETHASONE SODIUM PHOSPHATE 10 MG/ML IJ SOLN
INTRAMUSCULAR | Status: DC | PRN
  Administered 2021-05-08: 10 mg via INTRAVENOUS

## 2021-05-08 MED ORDER — OXYCODONE HCL 5 MG PO TABS: 5.0000 mg | ORAL_TABLET | Freq: Once | ORAL | Status: DC | PRN

## 2021-05-08 MED ORDER — BUPIVACAINE HCL (PF) 0.25 % IJ SOLN
INTRAMUSCULAR | Status: DC | PRN
  Administered 2021-05-08: 20 mL

## 2021-05-08 MED ORDER — FENTANYL CITRATE (PF) 100 MCG/2ML IJ SOLN
INTRAMUSCULAR | Status: DC | PRN
  Administered 2021-05-08 (×2): 50 ug via INTRAVENOUS

## 2021-05-08 MED ORDER — OXYCODONE HCL 5 MG/5ML PO SOLN: 5.0000 mg | Freq: Once | ORAL | Status: DC | PRN

## 2021-05-08 MED ORDER — SILVER NITRATE-POT NITRATE 75-25 % EX MISC
CUTANEOUS | Status: DC | PRN
  Administered 2021-05-08 (×2): 2

## 2021-05-08 MED ORDER — HYDROMORPHONE HCL 1 MG/ML IJ SOLN: 0.2500 mg | INTRAMUSCULAR | Status: DC | PRN

## 2021-05-08 MED ORDER — GABAPENTIN 300 MG PO CAPS
ORAL_CAPSULE | ORAL | Status: AC
  Filled 2021-05-08: qty 1

## 2021-05-08 MED ORDER — PROPOFOL 10 MG/ML IV BOLUS
INTRAVENOUS | Status: AC
  Filled 2021-05-08: qty 20

## 2021-05-08 MED ORDER — LACTATED RINGERS IV SOLN: INTRAVENOUS | Status: DC

## 2021-05-08 MED ORDER — DEXMEDETOMIDINE (PRECEDEX) IN NS 20 MCG/5ML (4 MCG/ML) IV SYRINGE
PREFILLED_SYRINGE | INTRAVENOUS | Status: DC | PRN
  Administered 2021-05-08: 8 ug via INTRAVENOUS

## 2021-05-08 MED ORDER — MIDAZOLAM HCL 2 MG/2ML IJ SOLN
INTRAMUSCULAR | Status: DC | PRN
  Administered 2021-05-08: 2 mg via INTRAVENOUS

## 2021-05-08 MED ORDER — SCOPOLAMINE 1 MG/3DAYS TD PT72
1.0000 | MEDICATED_PATCH | TRANSDERMAL | Status: DC
  Administered 2021-05-08: 1.5 mg via TRANSDERMAL

## 2021-05-08 MED ORDER — ONDANSETRON HCL 4 MG/2ML IJ SOLN
INTRAMUSCULAR | Status: AC
  Filled 2021-05-08: qty 2

## 2021-05-08 MED ORDER — SCOPOLAMINE 1 MG/3DAYS TD PT72
MEDICATED_PATCH | TRANSDERMAL | Status: AC
  Filled 2021-05-08: qty 1

## 2021-05-08 MED ORDER — LIDOCAINE HCL (PF) 2 % IJ SOLN
INTRAMUSCULAR | Status: AC
  Filled 2021-05-08: qty 5

## 2021-05-08 SURGICAL SUPPLY — 18 items
CATH ROBINSON RED A/P 16FR (CATHETERS) ×2 IMPLANT
DEVICE MYOSURE REACH (MISCELLANEOUS) ×1 IMPLANT
DRSG TELFA 3X8 NADH (GAUZE/BANDAGES/DRESSINGS) ×2 IMPLANT
ELECT REM PT RETURN 9FT ADLT (ELECTROSURGICAL)
ELECTRODE REM PT RTRN 9FT ADLT (ELECTROSURGICAL) IMPLANT
GAUZE 4X4 16PLY ~~LOC~~+RFID DBL (SPONGE) ×2 IMPLANT
GLOVE SURG ENC TEXT LTX SZ6.5 (GLOVE) ×4 IMPLANT
GLOVE SURG UNDER POLY LF SZ6.5 (GLOVE) ×4 IMPLANT
GOWN STRL REUS W/TWL LRG LVL3 (GOWN DISPOSABLE) ×4 IMPLANT
KIT PROCEDURE FLUENT (KITS) ×1 IMPLANT
KIT TURNOVER CYSTO (KITS) ×2 IMPLANT
PACK VAGINAL MINOR WOMEN LF (CUSTOM PROCEDURE TRAY) ×2 IMPLANT
PAD DRESSING TELFA 3X8 NADH (GAUZE/BANDAGES/DRESSINGS) ×1 IMPLANT
PAD OB MATERNITY 4.3X12.25 (PERSONAL CARE ITEMS) ×2 IMPLANT
SEAL ROD LENS SCOPE MYOSURE (ABLATOR) ×3 IMPLANT
SET GENESYS HTA PROCERVA (MISCELLANEOUS) IMPLANT
TOWEL OR 17X26 10 PK STRL BLUE (TOWEL DISPOSABLE) ×4 IMPLANT
UNDERPAD 30X36 HEAVY ABSORB (UNDERPADS AND DIAPERS) ×2 IMPLANT

## 2021-05-08 NOTE — Op Note (Signed)
05/08/2021  2:14 PM  PATIENT:  Dorothy Chan  46 y.o. female  PRE-OPERATIVE DIAGNOSIS:  Menorrhagia with Regular Cycle  POST-OPERATIVE DIAGNOSIS:  Menorrhagia with Regular Cycle  PROCEDURE:  Procedure(s): DILATATION & CURETTAGE/HYSTEROSCOPY WITH HYDROTHERMAL ABLATION AND MYOSURE (N/A)  SURGEON:  Surgeon(s) and Role:    Christophe Louis, MD - Primary  PHYSICIAN ASSISTANT:   ASSISTANTS: none   ANESTHESIA:   general  EBL:  5 cc    BLOOD ADMINISTERED:none  DRAINS: none   LOCAL MEDICATIONS USED:  MARCAINE     SPECIMEN:  Source of Specimen:  Endometrial mass and curettings   DISPOSITION OF SPECIMEN:  PATHOLOGY  COUNTS:  YES  TOURNIQUET:  * No tourniquets in log *  DICTATION: .Dragon Dictation and Note written in EPIC  PLAN OF CARE: Discharge to home after PACU  PATIENT DISPOSITION:  PACU - hemodynamically stable.   Delay start of Pharmacological VTE agent (>24hrs) due to surgical blood loss or risk of bleeding: not applicable   Findings 2 cm endometrial mass. Proliferative appearing endometrium.   Procedure: Patient was taken to the operating room #3 at [facility] where she was placed under general anesthesia. She was placed in the dorsal lithotomy position. She was prepped and draped in the usual sterile fashion. A speculum was placed into the vaginal vault. The anterior lip of the cervix was grasped with a single-tooth tenaculum. Quarter percent Marcaine was injected at the 4 and 8:00 positions of the cervix. The cervix was then sounded to 8 cm. The cervix was dilated to 6 mm.  Mysosure operative  hysteroscope was inserted. The findings noted above. Myosure reach  blade was introduced throught the hysteroscope. The endometrial mass was removed and endometrial curettings were obtained within 5 minute.  There was no evidence of perforation.   The hydrothermal hysteroscope was inserted. The ostia were visualized bilaterally. There was no evidence of perforation. The hydrothermal  ablation was performed for 10 minutes.  The hysteroscope was removed.  The single tooth tenaculum was removed.  Bleeding at the tenaculum site was controlled with Silver nitrate. Excellent hemostasis was noted.   Sponge lap and needle counts were correct x 2.  The patient was awakened from anesthesia and taken to the recovery room in stable condition.

## 2021-05-08 NOTE — Anesthesia Procedure Notes (Signed)
Procedure Name: LMA Insertion Date/Time: 05/08/2021 1:19 PM Performed by: Lieutenant Diego, CRNA Pre-anesthesia Checklist: Patient identified, Emergency Drugs available, Suction available and Patient being monitored Patient Re-evaluated:Patient Re-evaluated prior to induction Oxygen Delivery Method: Circle system utilized Preoxygenation: Pre-oxygenation with 100% oxygen Induction Type: IV induction Ventilation: Mask ventilation without difficulty LMA: LMA inserted LMA Size: 4.0 Number of attempts: 1 Placement Confirmation: positive ETCO2 and breath sounds checked- equal and bilateral Tube secured with: Tape Dental Injury: Teeth and Oropharynx as per pre-operative assessment

## 2021-05-08 NOTE — Discharge Instructions (Signed)
°  Post Anesthesia Home Care Instructions  Activity: Get plenty of rest for the remainder of the day. A responsible individual must stay with you for 24 hours following the procedure.  For the next 24 hours, DO NOT: -Drive a car -Paediatric nurse -Drink alcoholic beverages -Take any medication unless instructed by your physician -Make any legal decisions or sign important papers.  Meals: Start with liquid foods such as gelatin or soup. Progress to regular foods as tolerated. Avoid greasy, spicy, heavy foods. If nausea and/or vomiting occur, drink only clear liquids until the nausea and/or vomiting subsides. Call your physician if vomiting continues.  Special Instructions/Symptoms: Your throat may feel dry or sore from the anesthesia or the breathing tube placed in your throat during surgery. If this causes discomfort, gargle with warm salt water. The discomfort should disappear within 24 hours.  If you had a scopolamine patch placed behind your ear for the management of post- operative nausea and/or vomiting:  1. The medication in the patch is effective for 72 hours, after which it should be removed.  Wrap patch in a tissue and discard in the trash. Wash hands thoroughly with soap and water. 2. You may remove the patch earlier than 72 hours if you experience unpleasant side effects which may include dry mouth, dizziness or visual disturbances. 3. Avoid touching the patch. Wash your hands with soap and water after contact with the patch. 4. Remove patch by Friday 05/10/21.

## 2021-05-08 NOTE — Anesthesia Preprocedure Evaluation (Addendum)
Anesthesia Evaluation  Patient identified by MRN, date of birth, ID band Patient awake    Reviewed: Allergy & Precautions, NPO status , Patient's Chart, lab work & pertinent test results  Airway Mallampati: I  TM Distance: >3 FB Neck ROM: Full    Dental no notable dental hx. (+) Dental Advisory Given, Teeth Intact   Pulmonary neg pulmonary ROS,    Pulmonary exam normal breath sounds clear to auscultation       Cardiovascular negative cardio ROS Normal cardiovascular exam Rhythm:Regular Rate:Normal     Neuro/Psych PSYCHIATRIC DISORDERS Anxiety negative neurological ROS     GI/Hepatic negative GI ROS, Neg liver ROS,   Endo/Other  negative endocrine ROS  Renal/GU negative Renal ROS     Musculoskeletal negative musculoskeletal ROS (+)   Abdominal   Peds  Hematology negative hematology ROS (+)   Anesthesia Other Findings   Reproductive/Obstetrics negative OB ROS                            Anesthesia Physical Anesthesia Plan  ASA: 2  Anesthesia Plan: General   Post-op Pain Management: Tylenol PO (pre-op)*, Gabapentin PO (pre-op)* and Toradol IV (intra-op)*   Induction: Intravenous  PONV Risk Score and Plan: 4 or greater and Ondansetron, Dexamethasone, Treatment may vary due to age or medical condition, Midazolam and Scopolamine patch - Pre-op  Airway Management Planned: LMA  Additional Equipment:   Intra-op Plan:   Post-operative Plan: Extubation in OR  Informed Consent: I have reviewed the patients History and Physical, chart, labs and discussed the procedure including the risks, benefits and alternatives for the proposed anesthesia with the patient or authorized representative who has indicated his/her understanding and acceptance.     Dental advisory given  Plan Discussed with: CRNA  Anesthesia Plan Comments:        Anesthesia Quick Evaluation

## 2021-05-08 NOTE — Transfer of Care (Signed)
Immediate Anesthesia Transfer of Care Note  Patient: Dorothy Chan  Procedure(s) Performed: DILATATION & CURETTAGE/HYSTEROSCOPY WITH HYDROTHERMAL ABLATION AND MYOSURE (Uterus)  Patient Location: PACU  Anesthesia Type:General  Level of Consciousness: awake and alert   Airway & Oxygen Therapy: Patient Spontanous Breathing and Patient connected to face mask oxygen  Post-op Assessment: Report given to RN and Post -op Vital signs reviewed and stable  Post vital signs: Reviewed and stable  Last Vitals:  Vitals Value Taken Time  BP 119/73 05/08/21 1421  Temp    Pulse 70 05/08/21 1424  Resp 18 05/08/21 1424  SpO2 100 % 05/08/21 1424  Vitals shown include unvalidated device data.  Last Pain:  Vitals:   05/08/21 1215  TempSrc: Oral      Patients Stated Pain Goal: 5 (79/43/27 6147)  Complications: No notable events documented.

## 2021-05-08 NOTE — H&P (Signed)
Date of Initial H&P: 05/07/2021  History reviewed, patient examined, no change in status, stable for surgery.

## 2021-05-09 ENCOUNTER — Encounter (HOSPITAL_BASED_OUTPATIENT_CLINIC_OR_DEPARTMENT_OTHER): Payer: Self-pay | Admitting: Obstetrics and Gynecology

## 2021-05-09 NOTE — Anesthesia Postprocedure Evaluation (Signed)
Anesthesia Post Note  Patient: Dorothy Chan  Procedure(s) Performed: DILATATION & CURETTAGE/HYSTEROSCOPY WITH HYDROTHERMAL ABLATION AND MYOSURE (Uterus)     Patient location during evaluation: PACU Anesthesia Type: General Level of consciousness: sedated and patient cooperative Pain management: pain level controlled Vital Signs Assessment: post-procedure vital signs reviewed and stable Respiratory status: spontaneous breathing Cardiovascular status: stable Anesthetic complications: no   No notable events documented.  Last Vitals:  Vitals:   05/08/21 1455 05/08/21 1528  BP: 117/69 116/67  Pulse: 65 60  Resp: 14 14  Temp:  (!) 36.3 C  SpO2: 98% 99%    Last Pain:  Vitals:   05/08/21 1528  TempSrc:   PainSc: 0-No pain                 Nolon Nations

## 2021-05-10 LAB — SURGICAL PATHOLOGY

## 2021-05-27 DIAGNOSIS — M9903 Segmental and somatic dysfunction of lumbar region: Secondary | ICD-10-CM | POA: Diagnosis not present

## 2021-07-16 DIAGNOSIS — N83201 Unspecified ovarian cyst, right side: Secondary | ICD-10-CM | POA: Diagnosis not present

## 2021-08-08 DIAGNOSIS — M9903 Segmental and somatic dysfunction of lumbar region: Secondary | ICD-10-CM | POA: Diagnosis not present

## 2021-11-11 DIAGNOSIS — R635 Abnormal weight gain: Secondary | ICD-10-CM | POA: Diagnosis not present

## 2021-11-11 DIAGNOSIS — Z01419 Encounter for gynecological examination (general) (routine) without abnormal findings: Secondary | ICD-10-CM | POA: Diagnosis not present

## 2021-11-12 IMAGING — MG DIGITAL SCREENING BILAT W/ CAD
4 series · 4 of 4 positions shown · non-contrast
Comparison: None.

CLINICAL DATA: Screening.

EXAM:
DIGITAL SCREENING BILATERAL MAMMOGRAM WITH CAD

[R MLO]
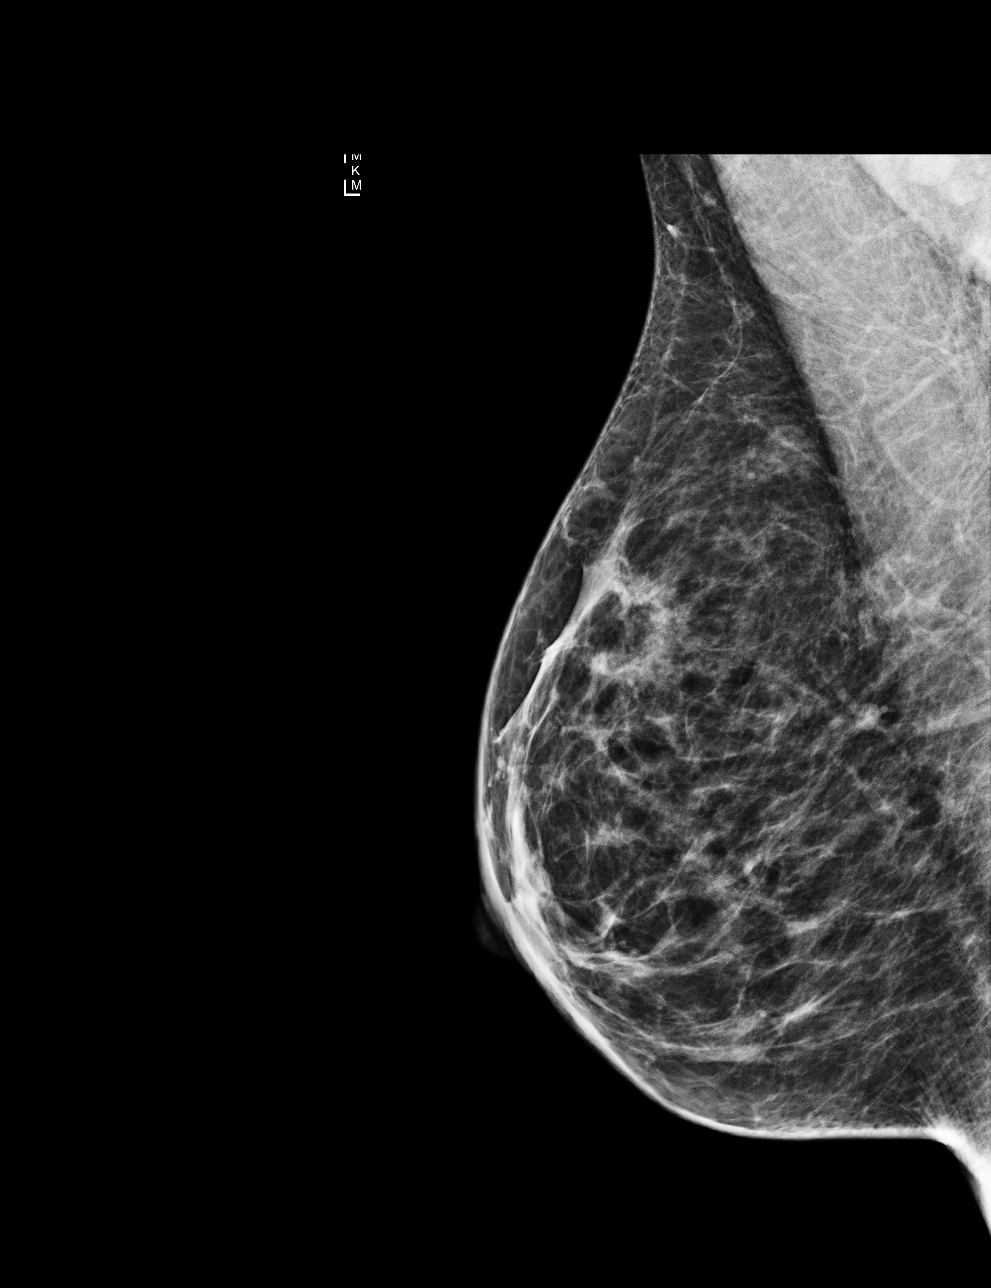

[L MLO]
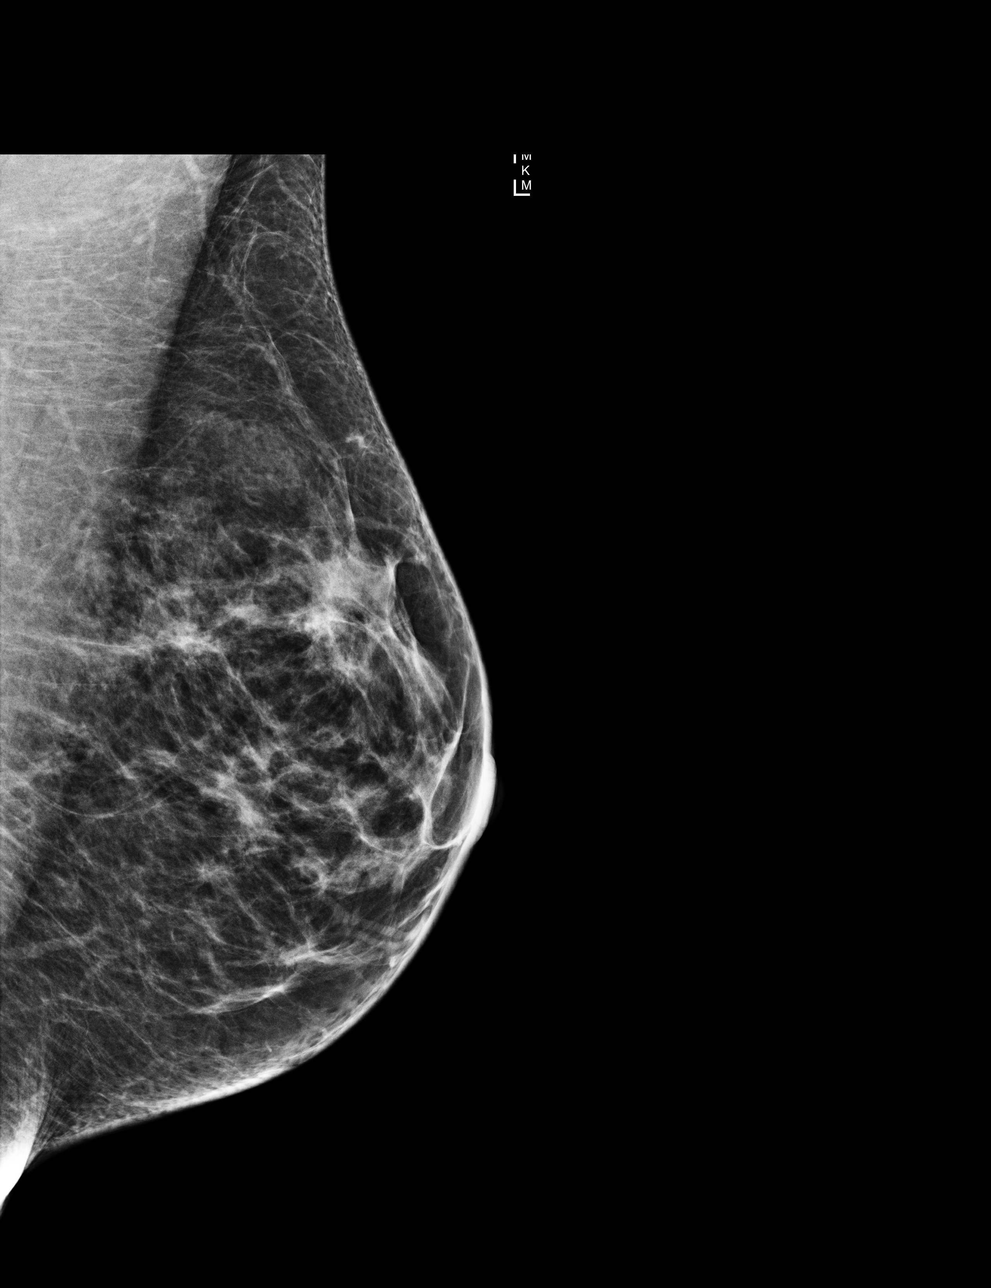

[R CC]
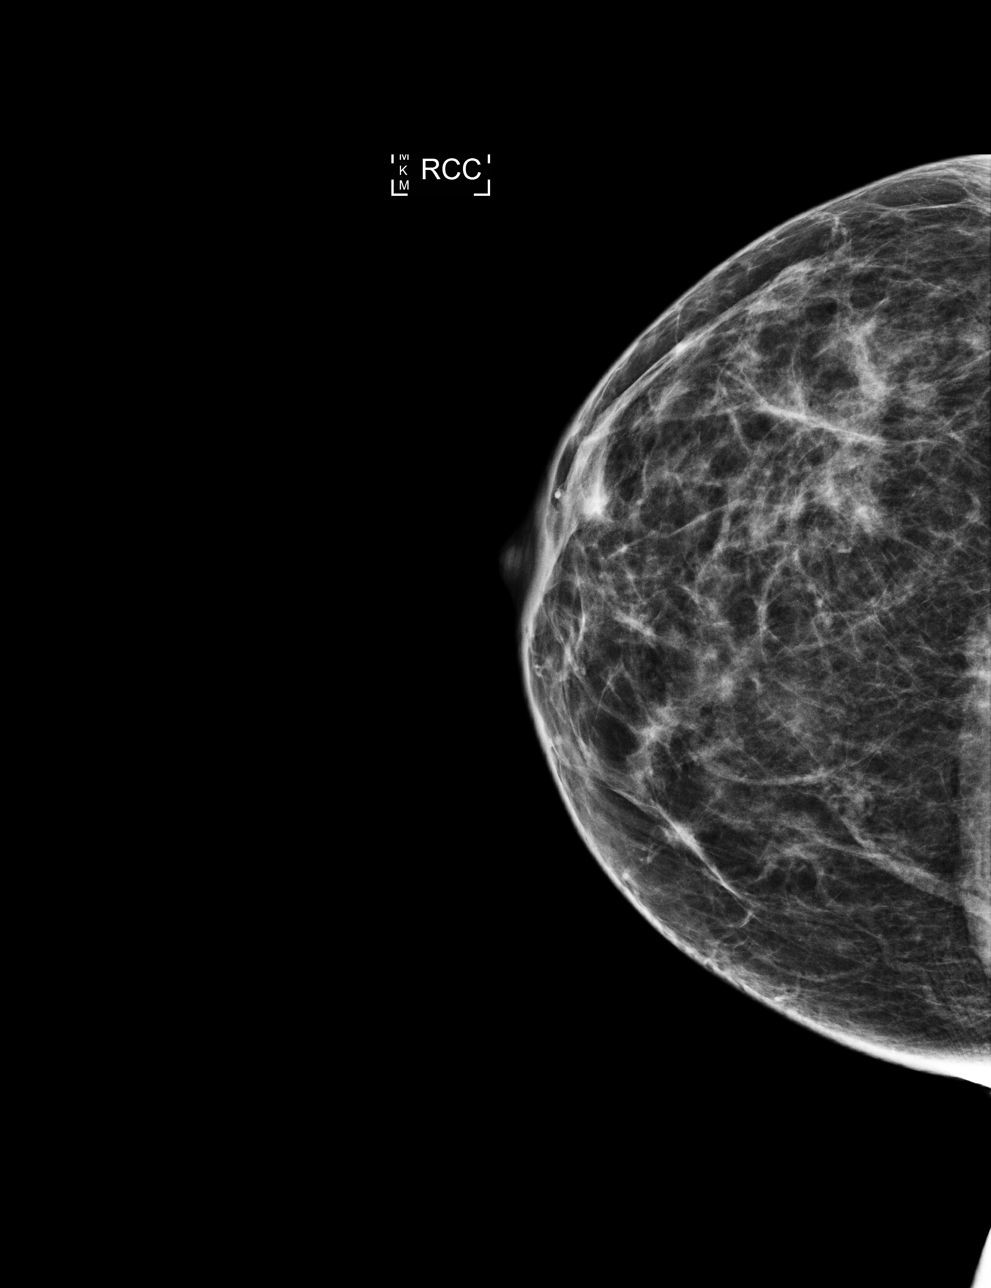

[L CC]
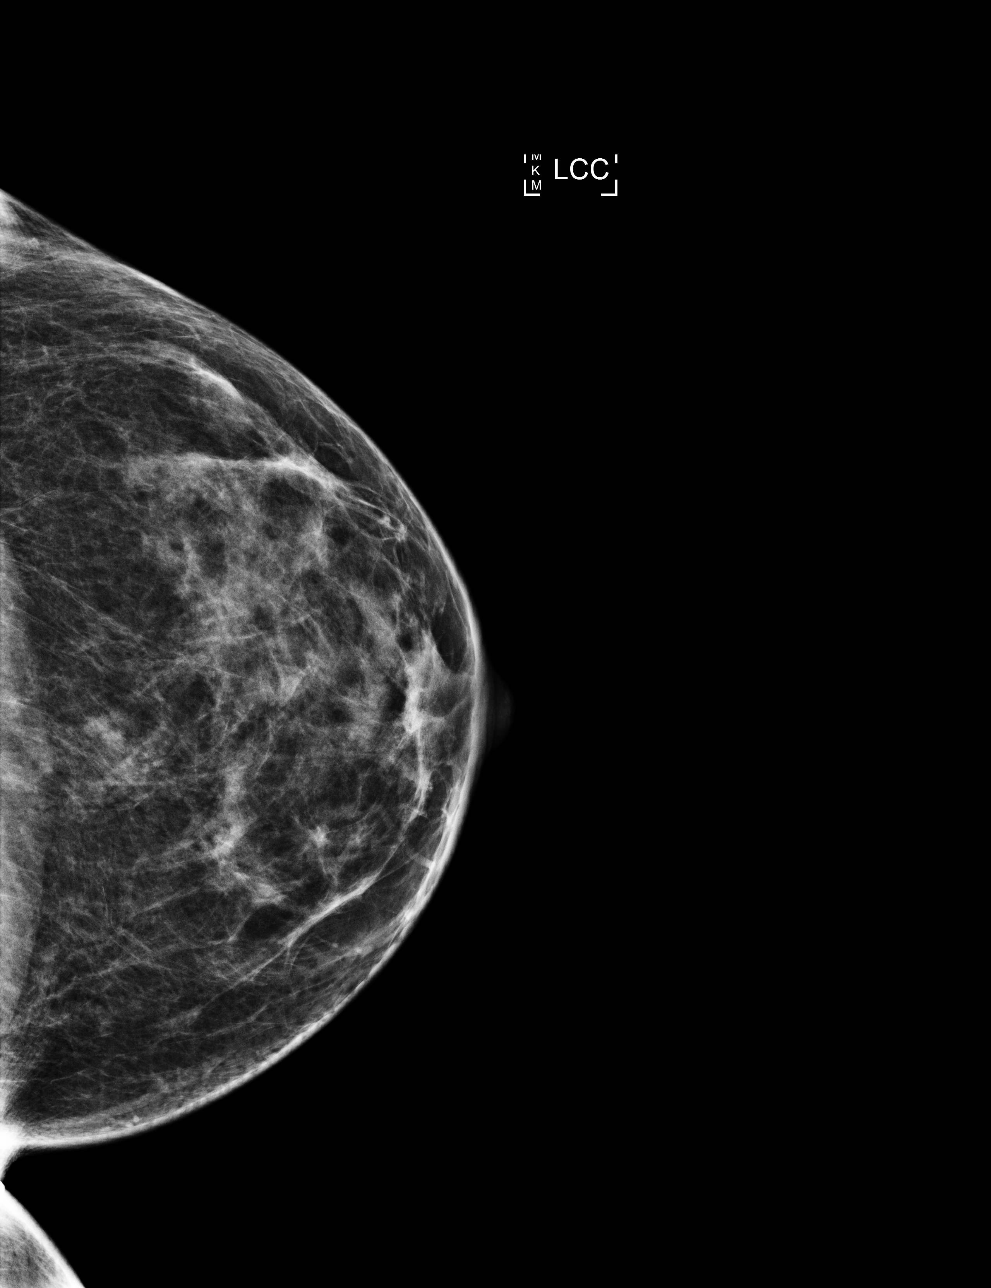

[4 of 4 positions shown; findings below may reference images not displayed]

ACR Breast Density Category b: There are scattered areas of
fibroglandular density.
FINDINGS: There are no findings suspicious for malignancy. Images were
processed with CAD.
IMPRESSION: No mammographic evidence of malignancy. A result letter of this
screening mammogram will be mailed directly to the patient.

RECOMMENDATION:
Screening mammogram in one year. (Code:[id number])

BI-RADS CATEGORY  1: Negative.

## 2021-12-08 DIAGNOSIS — S60221A Contusion of right hand, initial encounter: Secondary | ICD-10-CM | POA: Diagnosis not present

## 2022-01-14 DIAGNOSIS — M79641 Pain in right hand: Secondary | ICD-10-CM | POA: Diagnosis not present

## 2022-02-13 DIAGNOSIS — S6992XA Unspecified injury of left wrist, hand and finger(s), initial encounter: Secondary | ICD-10-CM | POA: Diagnosis not present

## 2022-07-08 ENCOUNTER — Other Ambulatory Visit: Payer: Self-pay | Admitting: Nurse Practitioner

## 2022-07-08 ENCOUNTER — Ambulatory Visit
Admission: RE | Admit: 2022-07-08 | Discharge: 2022-07-08 | Disposition: A | Discharge status: Home / Self Care | Payer: BC Managed Care – PPO | Source: Ambulatory Visit | Attending: Nurse Practitioner | Admitting: Nurse Practitioner

## 2022-07-08 DIAGNOSIS — Z021 Encounter for pre-employment examination: Secondary | ICD-10-CM

## 2022-07-10 DIAGNOSIS — M9903 Segmental and somatic dysfunction of lumbar region: Secondary | ICD-10-CM | POA: Diagnosis not present

## 2022-09-08 DIAGNOSIS — M9903 Segmental and somatic dysfunction of lumbar region: Secondary | ICD-10-CM | POA: Diagnosis not present

## 2022-10-08 ENCOUNTER — Ambulatory Visit (HOSPITAL_COMMUNITY)
Admission: RE | Admit: 2022-10-08 | Discharge: 2022-10-08 | Disposition: A | Discharge status: Home / Self Care | Payer: 59 | Source: Ambulatory Visit | Attending: Family Medicine | Admitting: Family Medicine

## 2022-10-08 ENCOUNTER — Other Ambulatory Visit (HOSPITAL_COMMUNITY): Payer: Self-pay | Admitting: Family Medicine

## 2022-10-08 DIAGNOSIS — R519 Headache, unspecified: Secondary | ICD-10-CM | POA: Diagnosis not present

## 2022-10-10 ENCOUNTER — Other Ambulatory Visit (HOSPITAL_COMMUNITY): Payer: Self-pay | Admitting: Nurse Practitioner

## 2022-10-10 ENCOUNTER — Ambulatory Visit (HOSPITAL_COMMUNITY)
Admission: RE | Admit: 2022-10-10 | Discharge: 2022-10-10 | Disposition: A | Discharge status: Home / Self Care | Payer: 59 | Source: Ambulatory Visit | Attending: Nurse Practitioner | Admitting: Nurse Practitioner

## 2022-10-10 DIAGNOSIS — R519 Headache, unspecified: Secondary | ICD-10-CM | POA: Insufficient documentation

## 2022-12-02 ENCOUNTER — Other Ambulatory Visit: Payer: Self-pay | Admitting: Nurse Practitioner

## 2022-12-02 ENCOUNTER — Other Ambulatory Visit (HOSPITAL_COMMUNITY)
Admission: RE | Admit: 2022-12-02 | Discharge: 2022-12-02 | Disposition: A | Discharge status: Home / Self Care | Payer: 59 | Source: Ambulatory Visit | Attending: Nurse Practitioner | Admitting: Nurse Practitioner

## 2022-12-02 DIAGNOSIS — R87610 Atypical squamous cells of undetermined significance on cytologic smear of cervix (ASC-US): Secondary | ICD-10-CM | POA: Diagnosis present

## 2022-12-12 LAB — CYTOLOGY - PAP
Comment: NEGATIVE
Diagnosis: UNDETERMINED — AB
High risk HPV: NEGATIVE

## 2023-04-10 ENCOUNTER — Encounter (HOSPITAL_BASED_OUTPATIENT_CLINIC_OR_DEPARTMENT_OTHER): Payer: Self-pay | Admitting: Emergency Medicine

## 2023-04-10 ENCOUNTER — Emergency Department (HOSPITAL_BASED_OUTPATIENT_CLINIC_OR_DEPARTMENT_OTHER)
Admission: EM | Admit: 2023-04-10 | Discharge: 2023-04-10 | Disposition: A | Discharge status: Home / Self Care | Payer: 59 | Attending: Emergency Medicine | Admitting: Emergency Medicine

## 2023-04-10 ENCOUNTER — Other Ambulatory Visit: Payer: Self-pay

## 2023-04-10 ENCOUNTER — Emergency Department (HOSPITAL_BASED_OUTPATIENT_CLINIC_OR_DEPARTMENT_OTHER): Payer: 59 | Admitting: Radiology

## 2023-04-10 DIAGNOSIS — Y9284 Military training ground as the place of occurrence of the external cause: Secondary | ICD-10-CM | POA: Insufficient documentation

## 2023-04-10 DIAGNOSIS — Y93A6 Activity, grass drills: Secondary | ICD-10-CM | POA: Diagnosis not present

## 2023-04-10 DIAGNOSIS — W1839XA Other fall on same level, initial encounter: Secondary | ICD-10-CM | POA: Diagnosis not present

## 2023-04-10 DIAGNOSIS — M79601 Pain in right arm: Secondary | ICD-10-CM | POA: Diagnosis present

## 2023-04-10 MED ORDER — HYDROCODONE-ACETAMINOPHEN 5-325 MG PO TABS: 1.0000 | ORAL_TABLET | Freq: Four times a day (QID) | ORAL | 0 refills | Status: AC | PRN

## 2023-04-10 MED ORDER — ACETAMINOPHEN 500 MG PO TABS
1000.0000 mg | ORAL_TABLET | Freq: Once | ORAL | Status: AC
  Administered 2023-04-10: 1000 mg via ORAL
  Filled 2023-04-10: qty 2

## 2023-04-10 NOTE — ED Notes (Signed)
ED Provider at bedside.

## 2023-04-10 NOTE — ED Notes (Signed)
Tearful during DG. Pt refused additional pain meds at this time. Encouraged to call for additional medications if needed. Okay with tylenol for now.

## 2023-04-10 NOTE — ED Provider Notes (Signed)
[place] EMERGENCY DEPARTMENT AT [facility] Provider Note   CSN: [id number] Arrival date & time: 04/10/23  1603     History  Chief Complaint  Patient presents with   Arm Injury    Dorothy Chan is a 48 y.o. female.  With history of anxiety presenting to the ED for evaluation of right arm pain.  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  She was on her back and had her arms pinned to the mats.  She was attempting to escape when she felt a snapping sensation to the right upper extremity.  She had immediate significant pain at that time.  This occurred approximately 1.5 hours prior to arrival.  She was given a dose of IM Toradol and encouraged to come to the emergency department for further evaluation.  She denies any numbness, weakness or tingling.  She reports difficulty moving the arm due to the pain.  Pain is localized mostly to the right elbow but radiates to the right wrist and shoulder.   Arm Injury      Home Medications Prior to Admission medications   Medication Sig Start Date End Date Taking? Authorizing Provider  HYDROcodone-acetaminophen (NORCO/VICODIN) 5-325 MG tablet Take 1 tablet by mouth every 6 (six) hours as needed. 04/10/23  Yes Nataliyah Packham, Edsel Petrin, PA-C  acetaminophen (TYLENOL) 500 MG tablet Take 1,000 mg by mouth every 6 (six) hours as needed for moderate pain.    [provider]  fluticasone (FLONASE) 50 MCG/ACT nasal spray Place into both nostrils daily.    [provider]  ibuprofen (ADVIL) 800 MG tablet Take 1 tablet (800 mg total) by mouth every 8 (eight) hours as needed. 05/08/21   Gerald Leitz, MD  Loratadine 10 MG CAPS Take by mouth.    [provider]      Allergies    Morphine    Review of Systems   Review of Systems  Musculoskeletal:  Positive for arthralgias.  All other systems reviewed and are negative.   Physical Exam Updated Vital Signs BP 123/84 (BP Location: Right Arm)    Pulse 97   Temp 98.6 F (37 C)   Resp 20   LMP 03/20/2023 (Approximate)   SpO2 100%  Physical Exam Vitals and nursing note reviewed.  Constitutional:      General: She is not in acute distress.    Appearance: Normal appearance. She is normal weight. She is not ill-appearing.  HENT:     Head: Normocephalic and atraumatic.  Pulmonary:     Effort: Pulmonary effort is normal. No respiratory distress.  Abdominal:     General: Abdomen is flat.  Musculoskeletal:     Cervical back: Neck supple.     Comments: Unable to range RUE.  Exquisite TTP to right shoulder, elbow.  No obvious deformity.  Grip strength 5/5 bilaterally.  Sensation intact in all digits.  Radial pulse 2+.  Compartments are soft.  Skin:    General: Skin is warm and dry.  Neurological:     Mental Status: She is alert and oriented to person, place, and time.  Psychiatric:        Mood and Affect: Mood normal.        Behavior: Behavior normal.     ED Results / Procedures / Treatments   Labs (all labs ordered are listed, but only abnormal results are displayed) Labs Reviewed - No data to display  EKG None  Radiology DG Shoulder Right Result  Date: 04/10/2023 CLINICAL DATA:  Fall with right shoulder pain EXAM: RIGHT SHOULDER - 2 VIEW COMPARISON:  None Available. FINDINGS: There is no evidence of fracture or dislocation. There is no evidence of arthropathy or other focal bone abnormality. Soft tissues are unremarkable. IMPRESSION: No acute fracture or dislocation. Electronically Signed   By: Agustin Cree M.D.   On: 04/10/2023 17:10   DG Wrist Complete Right Result Date: 04/10/2023 CLINICAL DATA:  Pain after fall, heard a snap. EXAM: RIGHT WRIST - COMPLETE 3+ VIEW COMPARISON:  None Available. FINDINGS: No acute fracture. The thumb appears to be subluxed on oblique and lateral views, but not frankly dislocated. Wrist alignment is otherwise normal. No erosions. Mild soft tissue edema. IMPRESSION: 1. No acute fracture. 2. The  thumb appears to be subluxed on oblique and lateral views, but not frankly dislocated. Recommend correlation with physical exam for site of pain. Electronically Signed   By: Narda Rutherford M.D.   On: 04/10/2023 17:07   DG Elbow Complete Right Result Date: 04/10/2023 CLINICAL DATA:  Fall at training exercises today with pain EXAM: RIGHT ELBOW - COMPLETE 3 VIEW COMPARISON:  None Available. FINDINGS: There is no evidence of fracture, dislocation, or joint effusion. There is no evidence of arthropathy or other focal bone abnormality. Soft tissues are unremarkable. IMPRESSION: No acute osseous abnormality Electronically Signed   By: Karen Kays M.D.   On: 04/10/2023 17:05    Procedures Procedures    Medications Ordered in ED Medications  acetaminophen (TYLENOL) tablet 1,000 mg (1,000 mg Oral Given 04/10/23 1702)    ED Course/ Medical Decision Making/ A&P Clinical Course as of 04/10/23 1817  Fri Apr 10, 2023  1659 DG Wrist Complete Right [AS]    Clinical Course User Index [AS] Michelle Piper, PA-C                                 Medical Decision Making Amount and/or Complexity of Data Reviewed Radiology: ordered.  Risk OTC drugs.  This patient presents to the ED for concern of right arm pain, this involves an extensive number of treatment options, and is a complaint that carries with it a high risk of complications and morbidity.  The differential diagnosis includes fracture, strain, sprain, contusion, dislocation  My initial workup includes imaging, pain control  Additional history obtained from: Nursing notes from this visit.  I ordered imaging studies including x-ray right wrist, elbow, shoulder I independently visualized and interpreted imaging which showed potential subluxation of right thumb, no acute osseous abnormalities I agree with the radiologist interpretation  Afebrile, hemodynamically stable.  48 year old female presenting to the ED for evaluation of right  arm pain.  Mostly to the right elbow, however does encompass the majority of the upper extremity.  Neurovascular status is intact.  Compartments are soft.  Exquisite tenderness to palpation and pain with movement of the arm.  X-rays negative for acute osseous abnormalities.  I did apply gentle traction to the right thumb.  She is able to range this digit and make the okay sign, lower suspicion for true subluxation.  Overall suspect muscular injury.  I offered patient sling and splint for the right upper extremity.  She declines.  Sent prescription for Norco and educated on potential side effects.  She was given contact information for orthopedics and encouraged to follow-up.  She was given return precautions.  Stable at discharge.  At this time there does  not appear to be any evidence of an acute emergency medical condition and the patient appears stable for discharge with appropriate outpatient follow up. Diagnosis was discussed with patient who verbalizes understanding of care plan and is agreeable to discharge. I have discussed return precautions with patient who verbalizes understanding. Patient encouraged to follow-up with orthopedics within 1 week. All questions answered.  Patient's case discussed with Dr. Earlene Plater who agrees with plan to discharge with follow-up.   Note: Portions of this report may have been transcribed using voice recognition software. Every effort was made to ensure accuracy; however, inadvertent computerized transcription errors may still be present.        Final Clinical Impression(s) / ED Diagnoses Final diagnoses:  Right arm pain    Rx / DC Orders ED Discharge Orders          Ordered    HYDROcodone-acetaminophen (NORCO/VICODIN) 5-325 MG tablet  Every 6 hours PRN        04/10/23 1811              Mora Bellman 04/10/23 1817    Laurence Spates, MD 04/11/23 1247

## 2023-04-10 NOTE — ED Triage Notes (Signed)
Injury to right arm during training drill Felt a snap while arm was pinned Had Toradol at 3:45pm IM

## 2023-04-10 NOTE — Discharge Instructions (Addendum)
You have been seen today for your complaint of right arm pain. Your imaging was reassuring and showed no acute osseous abnormalities, was concerning for potential subluxation of the right thumb. Your discharge medications include norco. This is an opioid pain medication. You should only take this medication as needed for severe pain. You should not drive, operate heavy machinery or make important decisions while taking this medication. You should use alternative methods for pain relief while taking this medication including stretching, gentle range of motion, and alternating tylenol and ibuprofen. Alternate tylenol and ibuprofen for pain. You may alternate these every 4 hours. You may take up to 800 mg of ibuprofen at a time and up to 1000 mg of tylenol. Do not exceed 4000 mg of Tylenol per day. Follow up with: Any orthopedic provider that you wish.  I have included the phone number for Dr. Christell Constant Please seek immediate medical care if you develop any of the following symptoms: You have numbness or tingling in the injured area. You lose a lot of strength in the injured area. At this time there does not appear to be the presence of an emergent medical condition, however there is always the potential for conditions to change. Please read and follow the below instructions.  Do not take your medicine if  develop an itchy rash, swelling in your mouth or lips, or difficulty breathing; call 911 and seek immediate emergency medical attention if this occurs.  You may review your lab tests and imaging results in their entirety on your MyChart account.  Please discuss all results of fully with your primary care provider and other specialist at your follow-up visit.  Note: Portions of this text may have been transcribed using voice recognition software. Every effort was made to ensure accuracy; however, inadvertent computerized transcription errors may still be present.
